# Patient Record
Sex: Female | Born: 1995
Health system: Southern US, Community
[De-identification: ages and names within clinical notes are randomized; demographics above are authoritative.]

## PROBLEM LIST (undated history)

## (undated) DIAGNOSIS — E0500 Thyrotoxicosis with diffuse goiter without thyrotoxic crisis or storm: Secondary | ICD-10-CM

## (undated) DIAGNOSIS — R109 Unspecified abdominal pain: Secondary | ICD-10-CM

## (undated) HISTORY — PX: INCISION AND DRAINAGE: SHX5863

## (undated) HISTORY — DX: Unspecified abdominal pain: R10.9

---
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
## 2017-05-28 DIAGNOSIS — N760 Acute vaginitis: Secondary | ICD-10-CM | POA: Diagnosis not present

## 2017-06-19 DIAGNOSIS — Z111 Encounter for screening for respiratory tuberculosis: Secondary | ICD-10-CM | POA: Diagnosis not present

## 2017-07-25 DIAGNOSIS — Z6834 Body mass index (BMI) 34.0-34.9, adult: Secondary | ICD-10-CM | POA: Diagnosis not present

## 2017-07-25 DIAGNOSIS — R69 Illness, unspecified: Secondary | ICD-10-CM | POA: Diagnosis not present

## 2017-07-25 DIAGNOSIS — Z713 Dietary counseling and surveillance: Secondary | ICD-10-CM | POA: Diagnosis not present

## 2017-07-25 DIAGNOSIS — Z299 Encounter for prophylactic measures, unspecified: Secondary | ICD-10-CM | POA: Diagnosis not present

## 2017-08-23 DIAGNOSIS — R42 Dizziness and giddiness: Secondary | ICD-10-CM | POA: Diagnosis not present

## 2017-08-23 DIAGNOSIS — Z713 Dietary counseling and surveillance: Secondary | ICD-10-CM | POA: Diagnosis not present

## 2017-08-23 DIAGNOSIS — Z299 Encounter for prophylactic measures, unspecified: Secondary | ICD-10-CM | POA: Diagnosis not present

## 2017-08-23 DIAGNOSIS — R69 Illness, unspecified: Secondary | ICD-10-CM | POA: Diagnosis not present

## 2017-10-14 DIAGNOSIS — R5383 Other fatigue: Secondary | ICD-10-CM | POA: Diagnosis not present

## 2017-10-14 DIAGNOSIS — Z79899 Other long term (current) drug therapy: Secondary | ICD-10-CM | POA: Diagnosis not present

## 2017-10-14 DIAGNOSIS — Z0000 Encounter for general adult medical examination without abnormal findings: Secondary | ICD-10-CM | POA: Diagnosis not present

## 2017-10-21 DIAGNOSIS — Z6837 Body mass index (BMI) 37.0-37.9, adult: Secondary | ICD-10-CM | POA: Diagnosis not present

## 2017-10-21 DIAGNOSIS — Z713 Dietary counseling and surveillance: Secondary | ICD-10-CM | POA: Diagnosis not present

## 2017-10-21 DIAGNOSIS — Z299 Encounter for prophylactic measures, unspecified: Secondary | ICD-10-CM | POA: Diagnosis not present

## 2017-10-21 DIAGNOSIS — R69 Illness, unspecified: Secondary | ICD-10-CM | POA: Diagnosis not present

## 2017-10-21 DIAGNOSIS — J029 Acute pharyngitis, unspecified: Secondary | ICD-10-CM | POA: Diagnosis not present

## 2017-12-05 DIAGNOSIS — Z6838 Body mass index (BMI) 38.0-38.9, adult: Secondary | ICD-10-CM | POA: Diagnosis not present

## 2017-12-05 DIAGNOSIS — Z3009 Encounter for other general counseling and advice on contraception: Secondary | ICD-10-CM | POA: Diagnosis not present

## 2017-12-05 DIAGNOSIS — Z299 Encounter for prophylactic measures, unspecified: Secondary | ICD-10-CM | POA: Diagnosis not present

## 2017-12-05 DIAGNOSIS — Z713 Dietary counseling and surveillance: Secondary | ICD-10-CM | POA: Diagnosis not present

## 2018-01-06 DIAGNOSIS — Z299 Encounter for prophylactic measures, unspecified: Secondary | ICD-10-CM | POA: Diagnosis not present

## 2018-01-06 DIAGNOSIS — K219 Gastro-esophageal reflux disease without esophagitis: Secondary | ICD-10-CM | POA: Insufficient documentation

## 2018-01-06 DIAGNOSIS — R42 Dizziness and giddiness: Secondary | ICD-10-CM | POA: Diagnosis not present

## 2018-01-06 DIAGNOSIS — Z6838 Body mass index (BMI) 38.0-38.9, adult: Secondary | ICD-10-CM | POA: Diagnosis not present

## 2018-01-06 DIAGNOSIS — F32A Depression, unspecified: Secondary | ICD-10-CM | POA: Insufficient documentation

## 2018-01-06 DIAGNOSIS — Z3009 Encounter for other general counseling and advice on contraception: Secondary | ICD-10-CM | POA: Diagnosis not present

## 2018-01-07 DIAGNOSIS — H81393 Other peripheral vertigo, bilateral: Secondary | ICD-10-CM | POA: Diagnosis not present

## 2018-01-07 DIAGNOSIS — G9009 Other idiopathic peripheral autonomic neuropathy: Secondary | ICD-10-CM | POA: Diagnosis not present

## 2018-01-07 DIAGNOSIS — I7389 Other specified peripheral vascular diseases: Secondary | ICD-10-CM | POA: Diagnosis not present

## 2018-01-23 DIAGNOSIS — Z789 Other specified health status: Secondary | ICD-10-CM | POA: Diagnosis not present

## 2018-01-23 DIAGNOSIS — Z299 Encounter for prophylactic measures, unspecified: Secondary | ICD-10-CM | POA: Diagnosis not present

## 2018-01-23 DIAGNOSIS — R000 Tachycardia, unspecified: Secondary | ICD-10-CM | POA: Diagnosis not present

## 2018-01-23 DIAGNOSIS — Z6839 Body mass index (BMI) 39.0-39.9, adult: Secondary | ICD-10-CM | POA: Diagnosis not present

## 2018-01-23 LAB — TSH: TSH: 0.09 — AB (ref 0.41–5.90)

## 2018-02-24 DIAGNOSIS — Z299 Encounter for prophylactic measures, unspecified: Secondary | ICD-10-CM | POA: Diagnosis not present

## 2018-02-24 DIAGNOSIS — Z6841 Body Mass Index (BMI) 40.0 and over, adult: Secondary | ICD-10-CM | POA: Diagnosis not present

## 2018-02-24 DIAGNOSIS — R69 Illness, unspecified: Secondary | ICD-10-CM | POA: Diagnosis not present

## 2018-02-24 DIAGNOSIS — R000 Tachycardia, unspecified: Secondary | ICD-10-CM | POA: Diagnosis not present

## 2018-02-26 DIAGNOSIS — Z30430 Encounter for insertion of intrauterine contraceptive device: Secondary | ICD-10-CM | POA: Diagnosis not present

## 2018-02-26 DIAGNOSIS — Z3202 Encounter for pregnancy test, result negative: Secondary | ICD-10-CM | POA: Diagnosis not present

## 2018-03-26 DIAGNOSIS — Z975 Presence of (intrauterine) contraceptive device: Secondary | ICD-10-CM | POA: Diagnosis not present

## 2018-03-27 ENCOUNTER — Encounter: Payer: Self-pay | Admitting: "Endocrinology

## 2018-03-27 ENCOUNTER — Ambulatory Visit (INDEPENDENT_AMBULATORY_CARE_PROVIDER_SITE_OTHER): Payer: 59 | Admitting: "Endocrinology

## 2018-03-27 VITALS — BP 127/87 | HR 109 | Ht 66.0 in | Wt 255.0 lb

## 2018-03-27 DIAGNOSIS — E0590 Thyrotoxicosis, unspecified without thyrotoxic crisis or storm: Secondary | ICD-10-CM

## 2018-03-27 NOTE — Progress Notes (Signed)
Endocrinology Consult Note    Subjective:    Patient ID: Alicia Larsen, female    DOB: 04/09/1995, PCP Alicia Larsen, Alicia B, MD.   Past Medical History:  Diagnosis Date  . Abdominal pain    History reviewed. No pertinent surgical history. Social History   Socioeconomic History  . Marital status: Single    Spouse name: Not on file  . Number of children: Not on file  . Years of education: Not on file  . Highest education level: Not on file  Occupational History  . Not on file  Social Needs  . Financial resource strain: Not on file  . Food insecurity:    Worry: Not on file    Inability: Not on file  . Transportation needs:    Medical: Not on file    Non-medical: Not on file  Tobacco Use  . Smoking status: Never Smoker  . Smokeless tobacco: Never Used  Substance and Sexual Activity  . Alcohol use: Not on file  . Drug use: Not on file  . Sexual activity: Not on file  Lifestyle  . Physical activity:    Days per week: Not on file    Minutes per session: Not on file  . Stress: Not on file  Relationships  . Social connections:    Talks on phone: Not on file    Gets together: Not on file    Attends religious service: Not on file    Active member of club or organization: Not on file    Attends meetings of clubs or organizations: Not on file    Relationship status: Not on file  Other Topics Concern  . Not on file  Social History Narrative   9th grade   Outpatient Encounter Medications as of 03/27/2018  Medication Sig  . escitalopram (LEXAPRO) 20 MG tablet Take by mouth daily.  . propranolol (INDERAL) 10 MG tablet Take 20 mg by mouth 2 (two) times daily.  . [DISCONTINUED] esomeprazole (NEXIUM) 40 MG capsule Take 1 capsule (40 mg total) by mouth daily before breakfast.   No facility-administered encounter medications on file as of 03/27/2018.     ALLERGIES: No Known Allergies  VACCINATION STATUS:  There is no immunization history on file for this  patient.   HPI  Alicia Larsen is 23 y.o. female who presents today with a medical history as above. she is being seen in consultation for hyperthyroidism requested by Alicia Larsen, Alicia B, MD.  she has been dealing with symptoms of anxiety, palpitations, sleep disturbance, tremors, heat intolerance for approximately 3 months. her most recent thyroid labs revealed suppressed TSH and high normal free T4.  She was initiated on propanolol 10 mg p.o. twice daily which helped minimally for palpitations.  she denies dysphagia, choking, shortness of breath, no recent voice change. These symptoms are progressively worsening and troubling to her.   she denies family history of thyroid dysfunction and denies family hx of thyroid cancer. she denies personal history of goiter. she is not on any anti-thyroid medications nor on any thyroid hormone supplements. she  is willing to proceed with appropriate work up and therapy for thyrotoxicosis.  Normal menstrual cycle, not planning pregnancy anytime soon.                           Review of systems  Constitutional: - weight loss, + fatigue, + subjective hyperthermia Eyes: no blurry vision, - xerophthalmia ENT: no sore throat, no nodules palpated in  throat, no dysphagia/odynophagia, nor hoarseness Cardiovascular: no Chest Pain, no Shortness of Breath, +  palpitations, no leg swelling Respiratory: no cough, no SOB Gastrointestinal: no Nausea, no Vomiting, no Diarhhea Musculoskeletal: no muscle/joint aches Skin: no rashes Neurological: +  tremors, no numbness, no tingling, no dizziness Psychiatric: no depression, +  anxiety   Objective:    BP 127/87   Pulse (!) 109   Ht 5\' 6"  (1.676 m)   Wt 255 lb (115.7 kg)   BMI 41.16 kg/m   Wt Readings from Last 3 Encounters:  03/27/18 255 lb (115.7 kg)  05/21/12 162 lb (73.5 kg) (93 %, Z= 1.51)*  04/16/12 161 lb (73 kg) (93 %, Z= 1.50)*   * Growth percentiles are based on CDC (Girls, 2-20 Years) data.                                                 Physical exam  Constitutional: + Obese, not in acute distress, + anxious state of mind Eyes: PERRLA, EOMI, - exophthalmos ENT: moist mucous membranes, +  thyromegaly, no cervical lymphadenopathy Cardiovascular: + Hyperactive precordial activity, + tachycardia, no Murmur/Rubs/Gallops Respiratory:  adequate breathing efforts, no gross chest deformity, Clear to auscultation bilaterally Gastrointestinal: abdomen soft, Non -tender, No distension, Bowel Sounds present Musculoskeletal: no gross deformities, strength intact in all four extremities Skin: moist, warm, no rashes Neurological: ++  tremor with outstretched hands,  + Deep Tendon Reflexes  on both lower extremities.   January 23, 2018 labs: Free T4 elevated at 1.85, TSH suppressed 0.094    Assessment & Plan:   1. Hyperthyroidism  she is being seen at a kind request of Vyas, Alicia B, MD. her history and most recent labs are reviewed, and she was examined clinically. Subjective and objective findings are consistent with thyrotoxicosis likely from primary hyperthyroidism. The potential risks of untreated thyrotoxicosis and the need for definitive therapy have been discussed in detail with her, and she agrees to proceed with diagnostic workup and treatment plan.   I like to repeat full profile thyroid function tests today and confirmatory thyroid uptake and scan will be scheduled to be done as soon as possible.   Options of therapy are discussed with her, including antithyroid medications and radioactive iodine thyroid ablation.  she will return in 10 days with her results for treatment decision.   I advised her to increase propanolol to 20 mg p.o. twice daily  for symptomatic relief.  - I advised her to maintain close follow up with Alicia Specking, MD for primary care needs.   - Time spent with the patient: 35 minutes, of which >50% was spent in obtaining information about her symptoms,  reviewing her previous labs, evaluations, and treatments, counseling her about her hyperthyroidism, and developing a plan to confirm the diagnosis and long term treatment as necessary.  [name] participated in the discussions, expressed understanding, and voiced agreement with the above plans.  All questions were answered to her satisfaction. she is encouraged to contact clinic should she have any questions or concerns prior to her return visit.  Follow up plan: Return in about 10 days (around 04/06/2018) for Labs Today- Non-Fasting Ok, Follow up with Thyroid Uptake and Scan.   Thank you for involving me in the care of this pleasant patient, and I will continue to update you with her progress.  Marquis LunchGebre Nida, MD [facility] Phone: [phone]  Fax: [phone]   03/27/2018, 9:26 AM  This note was partially dictated with voice recognition software. Similar sounding words can be transcribed inadequately or may not  be corrected upon review.

## 2018-03-28 LAB — THYROGLOBULIN ANTIBODY: Thyroglobulin Ab: 1 IU/mL (ref <=1)

## 2018-03-28 LAB — THYROID PEROXIDASE ANTIBODY: Thyroperoxidase Ab SerPl-aCnc: 475 IU/mL — ABNORMAL HIGH (ref <9)

## 2018-03-28 LAB — T4, FREE: FREE T4: 4.5 ng/dL — AB (ref 0.8–1.8)

## 2018-03-28 LAB — T3, FREE: T3, Free: 18 pg/mL — ABNORMAL HIGH (ref 2.3–4.2)

## 2018-03-28 LAB — TSH: TSH: 0.01 m[IU]/L — AB

## 2018-04-01 ENCOUNTER — Encounter (HOSPITAL_COMMUNITY): Payer: Self-pay

## 2018-04-01 ENCOUNTER — Encounter (HOSPITAL_COMMUNITY)
Admission: RE | Admit: 2018-04-01 | Discharge: 2018-04-01 | Disposition: A | Discharge status: Home / Self Care | Payer: 59 | Source: Ambulatory Visit | Attending: "Endocrinology | Admitting: "Endocrinology

## 2018-04-01 DIAGNOSIS — E0590 Thyrotoxicosis, unspecified without thyrotoxic crisis or storm: Secondary | ICD-10-CM | POA: Diagnosis not present

## 2018-04-01 MED ORDER — SODIUM IODIDE I-123 7.4 MBQ CAPS
500.0000 | ORAL_CAPSULE | Freq: Once | ORAL | Status: AC
  Administered 2018-04-01: 430 via ORAL

## 2018-04-02 ENCOUNTER — Encounter (HOSPITAL_COMMUNITY)
Admission: RE | Admit: 2018-04-02 | Discharge: 2018-04-02 | Disposition: A | Discharge status: Home / Self Care | Payer: 59 | Source: Ambulatory Visit | Attending: "Endocrinology | Admitting: "Endocrinology

## 2018-04-02 DIAGNOSIS — E0590 Thyrotoxicosis, unspecified without thyrotoxic crisis or storm: Secondary | ICD-10-CM | POA: Diagnosis not present

## 2018-04-08 ENCOUNTER — Ambulatory Visit: Payer: 59 | Admitting: "Endocrinology

## 2018-04-08 ENCOUNTER — Encounter: Payer: Self-pay | Admitting: "Endocrinology

## 2018-04-08 ENCOUNTER — Ambulatory Visit (INDEPENDENT_AMBULATORY_CARE_PROVIDER_SITE_OTHER): Payer: 59 | Admitting: "Endocrinology

## 2018-04-08 VITALS — BP 169/83 | HR 125 | Ht 66.0 in

## 2018-04-08 VITALS — BP 169/83 | HR 125 | Temp 99.2°F | Ht 66.0 in | Wt 247.0 lb

## 2018-04-08 DIAGNOSIS — E0590 Thyrotoxicosis, unspecified without thyrotoxic crisis or storm: Secondary | ICD-10-CM | POA: Diagnosis not present

## 2018-04-08 DIAGNOSIS — Z79899 Other long term (current) drug therapy: Secondary | ICD-10-CM | POA: Diagnosis not present

## 2018-04-08 DIAGNOSIS — E079 Disorder of thyroid, unspecified: Secondary | ICD-10-CM | POA: Diagnosis not present

## 2018-04-08 DIAGNOSIS — I10 Essential (primary) hypertension: Secondary | ICD-10-CM | POA: Diagnosis not present

## 2018-04-08 DIAGNOSIS — E0501 Thyrotoxicosis with diffuse goiter with thyrotoxic crisis or storm: Secondary | ICD-10-CM | POA: Diagnosis not present

## 2018-04-08 DIAGNOSIS — R000 Tachycardia, unspecified: Secondary | ICD-10-CM | POA: Diagnosis not present

## 2018-04-08 DIAGNOSIS — E0500 Thyrotoxicosis with diffuse goiter without thyrotoxic crisis or storm: Secondary | ICD-10-CM | POA: Diagnosis not present

## 2018-04-08 MED ORDER — PREDNISONE 20 MG PO TABS: 20.0000 mg | ORAL_TABLET | Freq: Every day | ORAL | 1 refills | Status: DC

## 2018-04-08 NOTE — Progress Notes (Signed)
Endocrinology follow-up  Note    Subjective:    Patient ID: Alicia Larsen, female    DOB: 1995-04-04, PCP Ignatius Specking, MD.   Past Medical History:  Diagnosis Date  . Abdominal pain    History reviewed. No pertinent surgical history. Social History   Socioeconomic History  . Marital status: Single    Spouse name: Not on file  . Number of children: Not on file  . Years of education: Not on file  . Highest education level: Not on file  Occupational History  . Not on file  Social Needs  . Financial resource strain: Not on file  . Food insecurity:    Worry: Not on file    Inability: Not on file  . Transportation needs:    Medical: Not on file    Non-medical: Not on file  Tobacco Use  . Smoking status: Never Smoker  . Smokeless tobacco: Never Used  Substance and Sexual Activity  . Alcohol use: Not Currently  . Drug use: Never  . Sexual activity: Not on file  Lifestyle  . Physical activity:    Days per week: Not on file    Minutes per session: Not on file  . Stress: Not on file  Relationships  . Social connections:    Talks on phone: Not on file    Gets together: Not on file    Attends religious service: Not on file    Active member of club or organization: Not on file    Attends meetings of clubs or organizations: Not on file    Relationship status: Not on file  Other Topics Concern  . Not on file  Social History Narrative   9th grade   Outpatient Encounter Medications as of 04/08/2018  Medication Sig  . escitalopram (LEXAPRO) 20 MG tablet Take by mouth daily.  . predniSONE (DELTASONE) 20 MG tablet Take 1 tablet (20 mg total) by mouth daily with breakfast.  . propranolol (INDERAL) 10 MG tablet Take 40 mg by mouth 3 (three) times daily with meals.   No facility-administered encounter medications on file as of 04/08/2018.     ALLERGIES: No Known Allergies  VACCINATION STATUS:  There is no immunization history on file for this  patient.   HPI  Alicia Larsen is 23 y.o. female who presents today to review her recent studies after she was seen in consultation for hyperthyroidism  requested by Ignatius Specking, MD.  she has been dealing with symptoms of anxiety, palpitations, sleep disturbance, tremors, heat intolerance for approximately 3 months.  The symptoms are getting worse.  She feels nauseous this morning, denies vomiting.  She has not been taking her propranolol twice a day due to nausea.  Her most recent thyroid function tests reveal higher thyroid hormone levels as well as thyroid uptake and scan shows 71.1% uptake in 24 hours suggesting Graves' disease.  she denies dysphagia, choking, shortness of breath, no recent voice change. These symptoms are progressively worsening and troubling to her.   she denies family history of thyroid dysfunction and denies family hx of thyroid cancer. she denies personal history of goiter. she is not on any anti-thyroid medications nor on any thyroid hormone supplements. she  is willing to proceed with appropriate work up and therapy for thyrotoxicosis.  Normal menstrual cycle, not planning pregnancy anytime soon.                           Review  of systems  Constitutional: - weight loss, + fatigue, + subjective hyperthermia Eyes: no blurry vision, - xerophthalmia ENT: no sore throat, no nodules palpated in throat, no dysphagia/odynophagia, nor hoarseness Cardiovascular: no Chest Pain, no Shortness of Breath, +  palpitations, no leg swelling Respiratory: no cough, no SOB Gastrointestinal: + Nausea, no Vomiting, no Diarhhea Musculoskeletal: no muscle/joint aches Skin: no rashes Neurological: +  tremors, no numbness, no tingling, no dizziness Psychiatric: no depression, +  anxiety   Objective:    BP (!) 169/83   Pulse (!) 125   Temp 99.2 F (37.3 C)   Ht 5\' 6"  (1.676 m)   Wt 247 lb (112 kg)   BMI 39.87 kg/m   Wt Readings from Last 3 Encounters:  04/08/18 247 lb (112  kg)  03/27/18 255 lb (115.7 kg)  05/21/12 162 lb (73.5 kg) (93 %, Z= 1.51)*   * Growth percentiles are based on CDC (Girls, 2-20 Years) data.                                                Physical exam  Constitutional: + Obese, + sick looking,  + anxious state of mind Eyes: PERRLA, EOMI, - exophthalmos ENT: moist mucous membranes, +  thyromegaly, no cervical lymphadenopathy Cardiovascular: + Hyperactive precordial activity, + tachycardia, no Murmur/Rubs/Gallops Respiratory:  adequate breathing efforts, no gross chest deformity, Clear to auscultation bilaterally Gastrointestinal: abdomen soft, Non -tender, No distension, Bowel Sounds present Musculoskeletal: no gross deformities, strength intact in all four extremities Skin: moist, warm, no rashes Neurological: ++  tremor with outstretched hands,  + Deep Tendon Reflexes  on both lower extremities.  Recent Results (from the past 2160 hour(s))  TSH     Status: Abnormal   Collection Time: 01/23/18 12:00 AM  Result Value Ref Range   TSH 0.09 (A) 0.41 - 5.90    Comment: free T4 1.85  TSH     Status: Abnormal   Collection Time: 03/27/18  9:21 AM  Result Value Ref Range   TSH 0.01 (L) mIU/L    Comment:           Reference Range .           > or = 20 Years  0.40-4.50 .                Pregnancy Ranges           First trimester    0.26-2.66           Second trimester   0.55-2.73           Third trimester    0.43-2.91   T4, free     Status: Abnormal   Collection Time: 03/27/18  9:21 AM  Result Value Ref Range   Free T4 4.5 (H) 0.8 - 1.8 ng/dL  T3, free     Status: Abnormal   Collection Time: 03/27/18  9:21 AM  Result Value Ref Range   T3, Free 18.0 (H) 2.3 - 4.2 pg/mL  Thyroid peroxidase antibody     Status: Abnormal   Collection Time: 03/27/18  9:21 AM  Result Value Ref Range   Thyroperoxidase Ab SerPl-aCnc 475 (H) <9 IU/mL  Thyroglobulin antibody     Status: None   Collection Time: 03/27/18  9:21 AM  Result Value Ref Range    Thyroglobulin Ab 1 <  or = 1 IU/mL    January 23, 2018 labs: Free T4 elevated at 1.85, TSH suppressed 0.094   April 02, 2018 thyroid uptake and scan FINDINGS: Homogeneous tracer distribution in both thyroid lobes.  No focal areas of increased or decreased tracer localization seen.  4 hour I-123 uptake = 55.2% (normal 5-20%)  24 hour I-123 uptake = 71.7% (normal 10-30%)  IMPRESSION: Normal thyroid scan.  Significantly elevated 4 hour and 24 hour radio iodine uptakes consistent with hyperthyroidism.  Overall, findings are consistent with Graves disease.  Assessment & Plan:   1.  Severe toxicosis  2.  Graves' disease 3.  Impending thyroid storm  she is being seen at a kind request of Vyas, Dhruv B, MD. her history and most recent labs are reviewed, and she was examined clinically.   -Her repeat work-up confirms primary hyperthyroidism from Graves' disease presenting with severe thyrotoxicosis with signs/symptoms suggestive of impending thyroid storm.    - She will need immediate intervention with inpatient treatment for thyroid storm before considering definitive antithyroid management which will take time.    -I discussed the rare possibility of thyroid storm with the patient and concern of high mortality with this diagnosis unless it is treated immediately.  [facility] understands [facility].  She is sent with a note to go to emergency room directly and not at home.    -She will need high-dose beta-blockers in the form of propanolol, high-dose propylthiouracil, hydrocortisone, iodine solution, as well as bile acid sequestrant as inpatient for the next several days.  She is advised to call this clinic to update of her progress and we will also attempt to call her back later today.  - I advised her to maintain close follow up with Ignatius SpeckingVyas, Dhruv B, MD for primary care needs.   - Time spent with the patient: 25 min, of which  >50% was spent in reviewing her  current and  previous labs/studies, previous treatments, and medications doses and developing a plan for long-term care based on the latest recommendations for standards of care.  Alyse LowRachel Tate participated in the discussions, expressed understanding, and voiced agreement with the above plans.  All questions were answered to her satisfaction. she is encouraged to contact clinic should she have any questions or concerns prior to her return visit.   Follow up plan: She is advised to return in 7 to 10 days for reevaluation.  Thank you for involving me in the care of this pleasant patient, and I will continue to update you with her progress.  Marquis LunchGebre Leamon Palau, MD [facility] Phone: [phone]  Fax: [phone]   04/08/2018, 2:56 PM  This note was partially dictated with voice recognition software. Similar sounding words can be transcribed inadequately or may not  be corrected upon review.

## 2018-04-09 DIAGNOSIS — K219 Gastro-esophageal reflux disease without esophagitis: Secondary | ICD-10-CM | POA: Diagnosis not present

## 2018-04-09 DIAGNOSIS — E876 Hypokalemia: Secondary | ICD-10-CM | POA: Diagnosis not present

## 2018-04-09 DIAGNOSIS — R002 Palpitations: Secondary | ICD-10-CM | POA: Diagnosis not present

## 2018-04-09 DIAGNOSIS — L0501 Pilonidal cyst with abscess: Secondary | ICD-10-CM | POA: Diagnosis not present

## 2018-04-09 DIAGNOSIS — L0291 Cutaneous abscess, unspecified: Secondary | ICD-10-CM | POA: Diagnosis not present

## 2018-04-09 DIAGNOSIS — I10 Essential (primary) hypertension: Secondary | ICD-10-CM | POA: Diagnosis not present

## 2018-04-09 DIAGNOSIS — E0500 Thyrotoxicosis with diffuse goiter without thyrotoxic crisis or storm: Secondary | ICD-10-CM | POA: Diagnosis not present

## 2018-04-09 DIAGNOSIS — R161 Splenomegaly, not elsewhere classified: Secondary | ICD-10-CM | POA: Diagnosis not present

## 2018-04-09 DIAGNOSIS — R42 Dizziness and giddiness: Secondary | ICD-10-CM | POA: Diagnosis not present

## 2018-04-09 DIAGNOSIS — R197 Diarrhea, unspecified: Secondary | ICD-10-CM | POA: Diagnosis not present

## 2018-04-09 DIAGNOSIS — N170 Acute kidney failure with tubular necrosis: Secondary | ICD-10-CM | POA: Diagnosis not present

## 2018-04-09 DIAGNOSIS — E0501 Thyrotoxicosis with diffuse goiter with thyrotoxic crisis or storm: Secondary | ICD-10-CM | POA: Diagnosis not present

## 2018-04-09 DIAGNOSIS — N179 Acute kidney failure, unspecified: Secondary | ICD-10-CM | POA: Diagnosis not present

## 2018-04-09 DIAGNOSIS — L0231 Cutaneous abscess of buttock: Secondary | ICD-10-CM | POA: Diagnosis not present

## 2018-04-09 DIAGNOSIS — E0591 Thyrotoxicosis, unspecified with thyrotoxic crisis or storm: Secondary | ICD-10-CM | POA: Diagnosis not present

## 2018-04-09 DIAGNOSIS — R69 Illness, unspecified: Secondary | ICD-10-CM | POA: Diagnosis not present

## 2018-04-10 ENCOUNTER — Telehealth: Payer: Self-pay

## 2018-04-10 ENCOUNTER — Ambulatory Visit: Payer: 59 | Admitting: "Endocrinology

## 2018-04-10 NOTE — Telephone Encounter (Signed)
Thank you very much. That is exactly what my worry was on her.

## 2018-04-10 NOTE — Telephone Encounter (Signed)
I called to check on the patient she states she is doing better patient is in the ICU at [facility] thinking she may be released on Friiday being treated for a Thyroid storm taking medications to stop the thyroid from working because is was causing her to have tachycardia

## 2018-04-11 DIAGNOSIS — R42 Dizziness and giddiness: Secondary | ICD-10-CM | POA: Insufficient documentation

## 2018-04-17 DIAGNOSIS — N170 Acute kidney failure with tubular necrosis: Secondary | ICD-10-CM | POA: Diagnosis not present

## 2018-04-17 DIAGNOSIS — L0501 Pilonidal cyst with abscess: Secondary | ICD-10-CM | POA: Diagnosis not present

## 2018-04-17 DIAGNOSIS — Z7952 Long term (current) use of systemic steroids: Secondary | ICD-10-CM | POA: Diagnosis not present

## 2018-04-17 DIAGNOSIS — R42 Dizziness and giddiness: Secondary | ICD-10-CM | POA: Diagnosis not present

## 2018-04-17 DIAGNOSIS — E0541 Thyrotoxicosis factitia with thyrotoxic crisis or storm: Secondary | ICD-10-CM | POA: Diagnosis not present

## 2018-04-17 DIAGNOSIS — N990 Postprocedural (acute) (chronic) kidney failure: Secondary | ICD-10-CM | POA: Diagnosis not present

## 2018-04-21 DIAGNOSIS — R42 Dizziness and giddiness: Secondary | ICD-10-CM | POA: Diagnosis not present

## 2018-04-21 DIAGNOSIS — L0501 Pilonidal cyst with abscess: Secondary | ICD-10-CM | POA: Diagnosis not present

## 2018-04-21 DIAGNOSIS — N170 Acute kidney failure with tubular necrosis: Secondary | ICD-10-CM | POA: Diagnosis not present

## 2018-04-21 DIAGNOSIS — Z7952 Long term (current) use of systemic steroids: Secondary | ICD-10-CM | POA: Diagnosis not present

## 2018-04-21 DIAGNOSIS — N990 Postprocedural (acute) (chronic) kidney failure: Secondary | ICD-10-CM | POA: Diagnosis not present

## 2018-04-21 DIAGNOSIS — E0541 Thyrotoxicosis factitia with thyrotoxic crisis or storm: Secondary | ICD-10-CM | POA: Diagnosis not present

## 2018-04-23 ENCOUNTER — Ambulatory Visit: Payer: 59 | Admitting: "Endocrinology

## 2018-04-23 DIAGNOSIS — N990 Postprocedural (acute) (chronic) kidney failure: Secondary | ICD-10-CM | POA: Diagnosis not present

## 2018-04-23 DIAGNOSIS — S21209A Unspecified open wound of unspecified back wall of thorax without penetration into thoracic cavity, initial encounter: Secondary | ICD-10-CM | POA: Diagnosis not present

## 2018-04-23 DIAGNOSIS — R42 Dizziness and giddiness: Secondary | ICD-10-CM | POA: Diagnosis not present

## 2018-04-23 DIAGNOSIS — Z299 Encounter for prophylactic measures, unspecified: Secondary | ICD-10-CM | POA: Diagnosis not present

## 2018-04-23 DIAGNOSIS — L0501 Pilonidal cyst with abscess: Secondary | ICD-10-CM | POA: Diagnosis not present

## 2018-04-23 DIAGNOSIS — Z6837 Body mass index (BMI) 37.0-37.9, adult: Secondary | ICD-10-CM | POA: Diagnosis not present

## 2018-04-23 DIAGNOSIS — E0591 Thyrotoxicosis, unspecified with thyrotoxic crisis or storm: Secondary | ICD-10-CM | POA: Diagnosis not present

## 2018-04-23 DIAGNOSIS — E0541 Thyrotoxicosis factitia with thyrotoxic crisis or storm: Secondary | ICD-10-CM | POA: Diagnosis not present

## 2018-04-23 DIAGNOSIS — R69 Illness, unspecified: Secondary | ICD-10-CM | POA: Diagnosis not present

## 2018-04-23 DIAGNOSIS — N170 Acute kidney failure with tubular necrosis: Secondary | ICD-10-CM | POA: Diagnosis not present

## 2018-04-23 DIAGNOSIS — Z7952 Long term (current) use of systemic steroids: Secondary | ICD-10-CM | POA: Diagnosis not present

## 2018-05-04 DIAGNOSIS — N990 Postprocedural (acute) (chronic) kidney failure: Secondary | ICD-10-CM | POA: Diagnosis not present

## 2018-05-04 DIAGNOSIS — L0501 Pilonidal cyst with abscess: Secondary | ICD-10-CM | POA: Diagnosis not present

## 2018-05-04 DIAGNOSIS — E0541 Thyrotoxicosis factitia with thyrotoxic crisis or storm: Secondary | ICD-10-CM | POA: Diagnosis not present

## 2018-05-07 ENCOUNTER — Ambulatory Visit (INDEPENDENT_AMBULATORY_CARE_PROVIDER_SITE_OTHER): Payer: 59 | Admitting: "Endocrinology

## 2018-05-07 ENCOUNTER — Encounter: Payer: Self-pay | Admitting: "Endocrinology

## 2018-05-07 VITALS — BP 130/89 | HR 87 | Ht 66.0 in | Wt 245.0 lb

## 2018-05-07 DIAGNOSIS — E0590 Thyrotoxicosis, unspecified without thyrotoxic crisis or storm: Secondary | ICD-10-CM

## 2018-05-07 DIAGNOSIS — E0500 Thyrotoxicosis with diffuse goiter without thyrotoxic crisis or storm: Secondary | ICD-10-CM | POA: Diagnosis not present

## 2018-05-07 MED ORDER — PREDNISONE 10 MG PO TABS: 10.0000 mg | ORAL_TABLET | Freq: Every day | ORAL | 1 refills | Status: DC

## 2018-05-07 NOTE — Progress Notes (Signed)
Endocrinology follow-up  Note    Subjective:    Patient ID: Alicia Larsen, female    DOB: Aug 16, 1995, PCP Ignatius Specking, MD.   Past Medical History:  Diagnosis Date  . Abdominal pain    History reviewed. No pertinent surgical history. Social History   Socioeconomic History  . Marital status: Single    Spouse name: Not on file  . Number of children: Not on file  . Years of education: Not on file  . Highest education level: Not on file  Occupational History  . Not on file  Social Needs  . Financial resource strain: Not on file  . Food insecurity:    Worry: Not on file    Inability: Not on file  . Transportation needs:    Medical: Not on file    Non-medical: Not on file  Tobacco Use  . Smoking status: Never Smoker  . Smokeless tobacco: Never Used  Substance and Sexual Activity  . Alcohol use: Not Currently  . Drug use: Never  . Sexual activity: Not on file  Lifestyle  . Physical activity:    Days per week: Not on file    Minutes per session: Not on file  . Stress: Not on file  Relationships  . Social connections:    Talks on phone: Not on file    Gets together: Not on file    Attends religious service: Not on file    Active member of club or organization: Not on file    Attends meetings of clubs or organizations: Not on file    Relationship status: Not on file  Other Topics Concern  . Not on file  Social History Narrative   9th grade   Outpatient Encounter Medications as of 05/07/2018  Medication Sig  . escitalopram (LEXAPRO) 20 MG tablet Take by mouth daily.  . methimazole (TAPAZOLE) 10 MG tablet Take 10 mg by mouth 2 (two) times daily with a meal.  . predniSONE (DELTASONE) 10 MG tablet Take 1 tablet (10 mg total) by mouth daily with breakfast.  . propranolol (INDERAL) 10 MG tablet Take 20 mg by mouth 2 (two) times daily with a meal.  . [DISCONTINUED] predniSONE (DELTASONE) 20 MG tablet Take 1 tablet (20 mg total) by mouth daily with breakfast.    No facility-administered encounter medications on file as of 05/07/2018.     ALLERGIES: No Known Allergies  VACCINATION STATUS:  There is no immunization history on file for this patient.   HPI  Alicia Larsen is 23 y.o. female .  She was diagnosed with primary hyperthyroidism from Graves' disease prior to her last visit on April 08, 2018 when she presented with symptoms consistent with thyroid storm.  She was treated as an inpatient for 9 days in [facility].  She was discharged on multiple daily dose of methimazole, propanolol, as well as prednisone.  She continues to improve symptomatically-denies palpitations, heat intolerance, nor tremors.  She does not have recent thyroid function tests.     Her most recent thyroid function tests reveal higher thyroid hormone levels as well as thyroid uptake and scan shows 71.1% uptake in 24 hours suggesting Graves' disease.   she denies family history of thyroid dysfunction and denies family hx of thyroid cancer. she denies personal history of goiter.she  is willing to proceed with appropriate work up and therapy for thyrotoxicosis.  She is not planning any pregnancy in the near future.  Review of systems  Constitutional: + weight loss, + fatigue, + subjective hyperthermia Eyes: no blurry vision, - xerophthalmia ENT: no sore throat, no nodules palpated in throat, no dysphagia/odynophagia, nor hoarseness Cardiovascular: no Chest Pain, no Shortness of Breath, -  palpitations, no leg swelling Respiratory: no cough, no SOB Gastrointestinal: -  Nausea, no Vomiting, no Diarhhea Musculoskeletal: no muscle/joint aches Skin: no rashes Neurological: -  tremors, no numbness, no tingling, no dizziness Psychiatric: no depression, +  anxiety   Objective:    BP 130/89   Pulse 87   Ht  (1.676 m)   Wt 245 lb (111.1 kg)   BMI 39.54 kg/m   Wt Readings from Last 3 Encounters:  05/07/18 245 lb (111.1 kg)   04/08/18 247 lb (112 kg)  03/27/18 255 lb (115.7 kg)                                                Physical exam  Constitutional: + Obese,  + anxious state of mind Eyes: PERRLA, EOMI, - exophthalmos ENT: moist mucous membranes, +  thyromegaly, no cervical lymphadenopathy Cardiovascular: No tachycardia,  no Murmur/Rubs/Gallops Respiratory:  adequate breathing efforts, no gross chest deformity, Clear to auscultation bilaterally Gastrointestinal: abdomen soft, Non -tender, No distension, Bowel Sounds present Musculoskeletal: no gross deformities, strength intact in all four extremities Skin: moist, warm, no rashes Neurological: +  tremor with outstretched hands,  + Deep Tendon Reflexes  on both lower extremities.  Recent Results (from the past 2160 hour(s))  TSH     Status: Abnormal   Collection Time: 03/27/18  9:21 AM  Result Value Ref Range   TSH 0.01 (L) mIU/L    Comment:           Reference Range .           > or = 20 Years  0.40-4.50 .                Pregnancy Ranges           First trimester    0.26-2.66           Second trimester   0.55-2.73           Third trimester    0.43-2.91   T4, free     Status: Abnormal   Collection Time: 03/27/18  9:21 AM  Result Value Ref Range   Free T4 4.5 (H) 0.8 - 1.8 ng/dL  T3, free     Status: Abnormal   Collection Time: 03/27/18  9:21 AM  Result Value Ref Range   T3, Free 18.0 (H) 2.3 - 4.2 pg/mL  Thyroid peroxidase antibody     Status: Abnormal   Collection Time: 03/27/18  9:21 AM  Result Value Ref Range   Thyroperoxidase Ab SerPl-aCnc 475 (H) <9 IU/mL  Thyroglobulin antibody     Status: None   Collection Time: 03/27/18  9:21 AM  Result Value Ref Range   Thyroglobulin Ab 1 < or = 1 IU/mL     April 02, 2018 thyroid uptake and scan FINDINGS: Homogeneous tracer distribution in both thyroid lobes.  No focal areas of increased or decreased tracer localization seen.  4 hour I-123 uptake = 55.2% (normal 5-20%)  24 hour  I-123 uptake = 71.7% (normal 10-30%)  IMPRESSION: Normal thyroid scan.  Significantly elevated 4 hour and 24 hour  radio iodine uptakes consistent with hyperthyroidism.  Overall, findings are consistent with Graves disease.    Assessment & Plan:   1.  Severe thyrotoxicosis  2.  Graves' disease 3 .  Status post thyroid storm-resolved  -She is status post 9 days of inpatient treatment for thyroid storm in [facility] with clinical response.  She will need definitive treatment with I-131 thyroid ablation once her thyroid is reasonably controlled.   She does not have recent thyroid function tests. -She will be sent to lab for new set of labs today and she will return in 1 week for reevaluation. -Her thyroid second scan was still recent from January 2020, I-131 treatment for ablation will be considered on her next visit. -In the meantime, I advised her to lower her methimazole to 10 mg p.o. twice daily, lower propanolol to 20 mg p.o. twice daily, lower her prednisone to 10 mg p.o. daily.  - I advised her to maintain close follow up with Ignatius Specking, MD for primary care needs.  - Time spent with the patient: 25 min, of which >50% was spent in reviewing her  current and  previous labs/studies, previous treatments, and medications doses and developing a plan for long-term care based on the latest recommendations for standards of care. Dvora Roesner participated in the discussions, expressed understanding, and voiced agreement with the above plans.  All questions were answered to her satisfaction. she is encouraged to contact clinic should she have any questions or concerns prior to her return visit.  Follow up plan: She is advised to return in 7 days for reevaluation.   Marquis Lunch, MD [facility] Phone: [phone]  Fax: [phone]   05/07/2018, 12:22 PM  This note was partially dictated with voice recognition  software. Similar sounding words can be transcribed inadequately or may not  be corrected upon review.

## 2018-05-08 LAB — T3, FREE: T3 FREE: 3.9 pg/mL (ref 2.3–4.2)

## 2018-05-08 LAB — TSH: TSH: 0.01 m[IU]/L — AB

## 2018-05-08 LAB — T4, FREE: FREE T4: 1.4 ng/dL (ref 0.8–1.8)

## 2018-05-14 ENCOUNTER — Encounter: Payer: Self-pay | Admitting: "Endocrinology

## 2018-05-14 ENCOUNTER — Ambulatory Visit (INDEPENDENT_AMBULATORY_CARE_PROVIDER_SITE_OTHER): Payer: 59 | Admitting: "Endocrinology

## 2018-05-14 VITALS — BP 133/85 | HR 91 | Ht 66.0 in | Wt 254.0 lb

## 2018-05-14 DIAGNOSIS — E0500 Thyrotoxicosis with diffuse goiter without thyrotoxic crisis or storm: Secondary | ICD-10-CM

## 2018-05-14 DIAGNOSIS — E0590 Thyrotoxicosis, unspecified without thyrotoxic crisis or storm: Secondary | ICD-10-CM

## 2018-05-14 NOTE — Progress Notes (Signed)
Endocrinology follow-up  Note    Subjective:    Patient Alicia Larsen, female    DOB: [birth year], PCP Ignatius Specking, MD.   Past Medical History:  Diagnosis Date  . Abdominal pain    History reviewed. No pertinent surgical history. Social History   Socioeconomic History  . Marital status: Single    Spouse name: Not on file  . Number of children: Not on file  . Years of education: Not on file  . Highest education level: Not on file  Occupational History  . Not on file  Social Needs  . Financial resource strain: Not on file  . Food insecurity:    Worry: Not on file    Inability: Not on file  . Transportation needs:    Medical: Not on file    Non-medical: Not on file  Tobacco Use  . Smoking status: Never Smoker  . Smokeless tobacco: Never Used  Substance and Sexual Activity  . Alcohol use: Not Currently  . Drug use: Never  . Sexual activity: Not on file  Lifestyle  . Physical activity:    Days per week: Not on file    Minutes per session: Not on file  . Stress: Not on file  Relationships  . Social connections:    Talks on phone: Not on file    Gets together: Not on file    Attends religious service: Not on file    Active member of club or organization: Not on file    Attends meetings of clubs or organizations: Not on file    Relationship status: Not on file  Other Topics Concern  . Not on file  Social History Narrative   9th grade   Outpatient Encounter Medications as of 05/14/2018  Medication Sig  . escitalopram (LEXAPRO) 20 MG tablet Take by mouth daily.  . predniSONE (DELTASONE) 10 MG tablet Take 1 tablet (10 mg total) by mouth daily with breakfast.  . propranolol (INDERAL) 10 MG tablet Take 20 mg by mouth 2 (two) times daily with a meal.  . [DISCONTINUED] methimazole (TAPAZOLE) 10 MG tablet Take 10 mg by mouth 2 (two) times daily with a meal.   No facility-administered encounter medications on file as of 05/14/2018.     ALLERGIES: No  Known Allergies  VACCINATION STATUS:  There is no immunization history on file for this patient.   HPI  Alicia Larsen is 23 y.o. female .  She was diagnosed with primary hyperthyroidism from Graves' disease prior to her last visit on April 08, 2018 when she presented with symptoms consistent with thyroid storm.  She was treated as an inpatient for 9 days in [facility].  She was discharged on multiple daily dose of methimazole, propanolol, as well as prednisone.  She continues to improve symptomatically-denies palpitations, heat intolerance, nor tremors.  She does not have recent thyroid function tests.     Her most recent thyroid function tests are consistent with treatment effect to manageable levels.  Her recent thyroid uptake and scan showed 71.1% uptake in 24 hours suggesting Graves' disease.     she denies family history of thyroid dysfunction and denies family hx of thyroid cancer. she denies personal history of goiter.she  is willing to proceed with appropriate work up and therapy for thyrotoxicosis.  She is not planning any pregnancy in the near future.  Review of systems  Constitutional: + weight gain,  + fatigue, + subjective hyperthermia Eyes: no blurry vision, - xerophthalmia ENT: no sore throat, no nodules palpated in throat, no dysphagia/odynophagia, nor hoarseness Cardiovascular: no Chest Pain, no Shortness of Breath, +  palpitations, no leg swelling Respiratory: no cough, no SOB Gastrointestinal: -  Nausea, no Vomiting, no Diarhhea Musculoskeletal: no muscle/joint aches Skin: no rashes Neurological: -  tremors, no numbness, no tingling, no dizziness Psychiatric: no depression, +  anxiety   Objective:    BP 133/85   Pulse 91   Ht 5\' 6"  (1.676 m)   Wt 254 lb (115.2 kg)   BMI 41.00 kg/m   Wt Readings from Last 3 Encounters:  05/14/18 254 lb (115.2 kg)  05/07/18 245 lb (111.1 kg)  04/08/18 247 lb (112 kg)                                                 Physical exam  Constitutional: + Obese,  + anxious state of mind Eyes: PERRLA, EOMI, - exophthalmos ENT: moist mucous membranes, +  thyromegaly, no cervical lymphadenopathy Cardiovascular: No tachycardia,  no Murmur/Rubs/Gallops Respiratory:  adequate breathing efforts, no gross chest deformity, Clear to auscultation bilaterally Gastrointestinal: abdomen soft, Non -tender, No distension, Bowel Sounds present Musculoskeletal: no gross deformities, strength intact in all four extremities Skin: moist, warm, no rashes Neurological: +  tremor with outstretched hands,  + Deep Tendon Reflexes  on both lower extremities.  Recent Results (from the past 2160 hour(s))  TSH     Status: Abnormal   Collection Time: 03/27/18  9:21 AM  Result Value Ref Range   TSH 0.01 (L) mIU/L    Comment:           Reference Range .           > or = 20 Years  0.40-4.50 .                Pregnancy Ranges           First trimester    0.26-2.66           Second trimester   0.55-2.73           Third trimester    0.43-2.91   T4, free     Status: Abnormal   Collection Time: 03/27/18  9:21 AM  Result Value Ref Range   Free T4 4.5 (H) 0.8 - 1.8 ng/dL  T3, free     Status: Abnormal   Collection Time: 03/27/18  9:21 AM  Result Value Ref Range   T3, Free 18.0 (H) 2.3 - 4.2 pg/mL  Thyroid peroxidase antibody     Status: Abnormal   Collection Time: 03/27/18  9:21 AM  Result Value Ref Range   Thyroperoxidase Ab SerPl-aCnc 475 (H) <9 IU/mL  Thyroglobulin antibody     Status: None   Collection Time: 03/27/18  9:21 AM  Result Value Ref Range   Thyroglobulin Ab 1 < or = 1 IU/mL  TSH     Status: Abnormal   Collection Time: 05/07/18 12:14 PM  Result Value Ref Range   TSH 0.01 (L) mIU/L    Comment:           Reference Range .           > or = 20 Years  0.40-4.50 .  Pregnancy Ranges           First trimester    0.26-2.66           Second trimester   0.55-2.73            Third trimester    0.43-2.91   T4, free     Status: None   Collection Time: 05/07/18 12:14 PM  Result Value Ref Range   Free T4 1.4 0.8 - 1.8 ng/dL  T3, free     Status: None   Collection Time: 05/07/18 12:14 PM  Result Value Ref Range   T3, Free 3.9 2.3 - 4.2 pg/mL     April 02, 2018 thyroid uptake and scan FINDINGS: Homogeneous tracer distribution in both thyroid lobes.  No focal areas of increased or decreased tracer localization seen.  4 hour I-123 uptake = 55.2% (normal 5-20%)  24 hour I-123 uptake = 71.7% (normal 10-30%)  IMPRESSION: Normal thyroid scan.  Significantly elevated 4 hour and 24 hour radio iodine uptakes consistent with hyperthyroidism.  Overall, findings are consistent with Graves disease.    Assessment & Plan:   1.  Severe thyrotoxicosis  2.  Graves' disease 3 .  Status post thyroid storm-resolved  -She is status post 9 days of inpatient treatment for thyroid storm in [facility] with clinical response.  Her most recent thyroid function tests are showing treatment effect to manageable levels.  She will need definitive treatment with I-131 thyroid ablation . -She is advised to discontinue methimazole today.  She will be called from nuclear medicine for I-131 thyroid ablation 5 days later.  She understands the subsequent right long need for thyroid hormone replacement alone thyroid ablation with I-131.  -In the meantime, she is advised to continue propanolol 20 mg p.o. twice daily, prednisone 10 mg p.o. daily.  - I advised her to maintain close follow up with Ignatius Specking, MD for primary care needs.  Ethelyne Lince participated in the discussions, expressed understanding, and voiced agreement with the above plans.  All questions were answered to her satisfaction. she is encouraged to contact clinic should she have any questions or concerns prior to her return visit.   Follow up plan: She is advised to return in 10 weeks with thyroid  function tests for reevaluation.   Marquis Lunch, MD [facility] Phone: [phone]  Fax: [phone]   05/14/2018, 1:08 PM  This note was partially dictated with voice recognition software. Similar sounding words can be transcribed inadequately or may not  be corrected upon review.

## 2018-05-14 NOTE — Progress Notes (Signed)
LeighAnn Harrell Niehoff, CMA  

## 2018-05-20 ENCOUNTER — Other Ambulatory Visit: Payer: Self-pay | Admitting: "Endocrinology

## 2018-05-20 DIAGNOSIS — Z01818 Encounter for other preprocedural examination: Secondary | ICD-10-CM

## 2018-05-30 ENCOUNTER — Encounter (HOSPITAL_COMMUNITY): Payer: Self-pay

## 2018-05-30 ENCOUNTER — Other Ambulatory Visit: Payer: Self-pay

## 2018-05-30 ENCOUNTER — Encounter (HOSPITAL_COMMUNITY)
Admission: RE | Admit: 2018-05-30 | Discharge: 2018-05-30 | Disposition: A | Discharge status: Home / Self Care | Payer: BC Managed Care – PPO | Source: Ambulatory Visit | Attending: "Endocrinology | Admitting: "Endocrinology

## 2018-05-30 ENCOUNTER — Other Ambulatory Visit (HOSPITAL_COMMUNITY)
Admission: RE | Admit: 2018-05-30 | Discharge: 2018-05-30 | Disposition: A | Discharge status: Home / Self Care | Payer: BC Managed Care – PPO | Source: Ambulatory Visit | Attending: "Endocrinology | Admitting: "Endocrinology

## 2018-05-30 DIAGNOSIS — Z01818 Encounter for other preprocedural examination: Secondary | ICD-10-CM | POA: Insufficient documentation

## 2018-05-30 DIAGNOSIS — E0590 Thyrotoxicosis, unspecified without thyrotoxic crisis or storm: Secondary | ICD-10-CM | POA: Diagnosis present

## 2018-05-30 DIAGNOSIS — E0500 Thyrotoxicosis with diffuse goiter without thyrotoxic crisis or storm: Secondary | ICD-10-CM | POA: Insufficient documentation

## 2018-05-30 LAB — PREGNANCY, URINE: PREG TEST UR: NEGATIVE

## 2018-05-30 MED ORDER — SODIUM IODIDE I 131 CAPSULE
12.0000 | Freq: Once | INTRAVENOUS | Status: AC | PRN
  Administered 2018-05-30: 12.48 via ORAL

## 2018-06-09 ENCOUNTER — Other Ambulatory Visit: Payer: Self-pay | Admitting: "Endocrinology

## 2018-06-09 NOTE — Telephone Encounter (Signed)
Pt has had thyroid ablation. Does she still need prednisone?

## 2018-07-21 LAB — TSH: TSH: 0.01 — AB (ref 0.41–5.90)

## 2018-07-23 ENCOUNTER — Other Ambulatory Visit: Payer: Self-pay

## 2018-07-23 ENCOUNTER — Encounter: Payer: Self-pay | Admitting: "Endocrinology

## 2018-07-23 ENCOUNTER — Other Ambulatory Visit: Payer: Self-pay | Admitting: "Endocrinology

## 2018-07-23 ENCOUNTER — Ambulatory Visit (INDEPENDENT_AMBULATORY_CARE_PROVIDER_SITE_OTHER): Payer: 59 | Admitting: "Endocrinology

## 2018-07-23 DIAGNOSIS — E890 Postprocedural hypothyroidism: Secondary | ICD-10-CM

## 2018-07-23 MED ORDER — LEVOTHYROXINE SODIUM 50 MCG PO TABS: 50.0000 ug | ORAL_TABLET | Freq: Every day | ORAL | 3 refills | Status: DC

## 2018-07-23 NOTE — Progress Notes (Signed)
07/23/2018                                Endocrinology Telehealth Visit Follow up Note -During COVID -19 Pandemic  I connected with Alicia Larsen on 07/23/2018   by telephone and verified that I am speaking with the correct person using two identifiers. Alicia Larsen, 04/14/1995. she has verbally consented to this visit. All issues noted in this document were discussed and addressed. The format was not optimal for physical exam.    Subjective:    Patient ID: Alicia Larsen, female    DOB: 04-Aug-1995, PCP Alicia Specking, MD.   Past Medical History:  Diagnosis Date  . Abdominal pain    History reviewed. No pertinent surgical history. Social History   Socioeconomic History  . Marital status: Single    Spouse name: Not on file  . Number of children: Not on file  . Years of education: Not on file  . Highest education level: Not on file  Occupational History  . Not on file  Social Needs  . Financial resource strain: Not on file  . Food insecurity:    Worry: Not on file    Inability: Not on file  . Transportation needs:    Medical: Not on file    Non-medical: Not on file  Tobacco Use  . Smoking status: Never Smoker  . Smokeless tobacco: Never Used  Substance and Sexual Activity  . Alcohol use: Not Currently  . Drug use: Never  . Sexual activity: Not on file  Lifestyle  . Physical activity:    Days per week: Not on file    Minutes per session: Not on file  . Stress: Not on file  Relationships  . Social connections:    Talks on phone: Not on file    Gets together: Not on file    Attends religious service: Not on file    Active member of club or organization: Not on file    Attends meetings of clubs or organizations: Not on file    Relationship status: Not on file  Other Topics Concern  . Not on file  Social History Narrative   9th grade   Outpatient Encounter Medications as of 07/23/2018  Medication Sig  . escitalopram (LEXAPRO) 20 MG tablet Take by  mouth daily.  Marland Kitchen levothyroxine (SYNTHROID) 50 MCG tablet Take 1 tablet (50 mcg total) by mouth daily before breakfast.  . [DISCONTINUED] predniSONE (DELTASONE) 10 MG tablet Take 1 tablet (10 mg total) by mouth daily with breakfast.  . [DISCONTINUED] propranolol (INDERAL) 10 MG tablet Take 20 mg by mouth 2 (two) times daily with a meal.   No facility-administered encounter medications on file as of 07/23/2018.     ALLERGIES: No Known Allergies  VACCINATION STATUS:  There is no immunization history on file for this patient.   HPI  Alicia Larsen is 23 y.o. female .  She is s/p I131 thyroid ablation for primary hyperthyroidism complicated by thyroid storm.  She received therapy on march 10,2020. She continued to feel better. She remains on Prop  po BID. She has no new complaints.    Her most recent thyroid function tests are consistent with treatment effect with Larsen ft4 indicationg hypothyroidism.  she denies family history of thyroid dysfunction and denies family hx of thyroid cancer. she denies personal history of goiter.    She is not planning any pregnancy in the near future.  Review of systems- limited as above.   Objective:    There were no vitals taken for this visit.  Wt Readings from Last 3 Encounters:  05/14/18 254 lb (115.2 kg)  05/07/18 245 lb (111.1 kg)  04/08/18 247 lb (112 kg)                                                Physical exam   Recent Results (from the past 2160 hour(s))  TSH     Status: Abnormal   Collection Time: 05/07/18 12:14 PM  Result Value Ref Range   TSH 0.01 (L) mIU/L    Comment:           Reference Range .           > or = 20 Years  0.40-4.50 .                Pregnancy Ranges           First trimester    0.26-2.66           Second trimester   0.55-2.73           Third trimester    0.43-2.91   T4, free     Status: None   Collection Time: 05/07/18 12:14 PM  Result Value Ref Range   Free T4 1.4  0.8 - 1.8 ng/dL  T3, free     Status: None   Collection Time: 05/07/18 12:14 PM  Result Value Ref Range   T3, Free 3.9 2.3 - 4.2 pg/mL  Pregnancy, urine     Status: None   Collection Time: 05/30/18  7:58 AM  Result Value Ref Range   Preg Test, Ur NEGATIVE NEGATIVE    Comment:        THE SENSITIVITY OF THIS METHODOLOGY IS >20 mIU/mL. Performed at [facility], [address], [place], [place] [address]   TSH     Status: Abnormal   Collection Time: 07/21/18 12:00 AM  Result Value Ref Range   TSH 0.01 (A) 0.41 - 5.90    Comment: free t4 0.78 ( o.82-1.77).    I131 Thyroid ablation on 05/20/2018     Assessment & Plan:   1. RAI induced hypothyroidism 2 .  Hx of  thyroid storm-resolved  - Her pre visit labs show RAI induced hypothyroidism with TSH still suppressed. She will benefit from initiation of thyroid hormone replacement. I discuused and prescribed Lt4 50 mcg po qam.   - We discussed about the correct intake of her thyroid hormone, on empty stomach at fasting, with water, separated by at least 30 minutes from breakfast and other medications,  and separated by more than 4 hours from calcium, iron, multivitamins, acid reflux medications (PPIs). -Patient is made aware of the fact that thyroid hormone replacement is needed for life, dose to be adjusted by periodic monitoring of thyroid function tests.  -In the meantime, she is advised to continue propanolol 10 mg p.o. daily.  - I advised her to maintain close follow up with Alicia Specking, MD for primary care needs.  Time for visit 15 minutes.  Alicia Larsen participated in the discussions, expressed understanding, and voiced agreement with the above plans.  All questions were answered to her satisfaction. she is encouraged to contact clinic should she have any questions or concerns prior to her  return visit.  Follow up plan: She is advised to return in 10 weeks with thyroid function tests for reevaluation.   Alicia LunchGebre  Alicia Vanvalkenburgh, MD [facility] Phone: [phone]  Fax: [phone]   07/23/2018, 3:50 PM  This note was partially dictated with voice recognition software. Similar sounding words can be transcribed inadequately or may not  be corrected upon review.

## 2018-08-29 LAB — BASIC METABOLIC PANEL
BUN: 11 (ref 4–21)
Creatinine: 0.8 (ref 0.5–1.1)

## 2018-08-29 LAB — TSH: TSH: 54 — AB (ref 0.41–5.90)

## 2018-09-24 ENCOUNTER — Ambulatory Visit: Payer: BC Managed Care – PPO | Admitting: "Endocrinology

## 2018-09-24 ENCOUNTER — Other Ambulatory Visit: Payer: Self-pay

## 2018-09-24 ENCOUNTER — Other Ambulatory Visit: Payer: Self-pay | Admitting: "Endocrinology

## 2018-09-24 MED ORDER — LEVOTHYROXINE SODIUM 100 MCG PO TABS: 100.0000 ug | ORAL_TABLET | Freq: Every day | ORAL | 3 refills | Status: DC

## 2018-10-15 ENCOUNTER — Other Ambulatory Visit: Payer: Self-pay | Admitting: "Endocrinology

## 2018-10-15 ENCOUNTER — Other Ambulatory Visit: Payer: Self-pay

## 2018-10-15 MED ORDER — LEVOTHYROXINE SODIUM 100 MCG PO TABS: 100.0000 ug | ORAL_TABLET | Freq: Every day | ORAL | 3 refills | Status: DC

## 2018-10-21 LAB — TSH: TSH: 67 — AB (ref 0.41–5.90)

## 2018-10-22 ENCOUNTER — Ambulatory Visit (INDEPENDENT_AMBULATORY_CARE_PROVIDER_SITE_OTHER): Payer: BC Managed Care – PPO | Admitting: "Endocrinology

## 2018-10-22 ENCOUNTER — Other Ambulatory Visit: Payer: Self-pay

## 2018-10-22 ENCOUNTER — Encounter: Payer: Self-pay | Admitting: "Endocrinology

## 2018-10-22 DIAGNOSIS — E890 Postprocedural hypothyroidism: Secondary | ICD-10-CM

## 2018-10-22 LAB — LIPID PANEL
HDL: 44 (ref 35–70)
LDL Cholesterol: 160

## 2018-10-22 LAB — BASIC METABOLIC PANEL
BUN: 12 (ref 4–21)
Creatinine: 0.9 (ref 0.5–1.1)

## 2018-10-22 MED ORDER — LEVOTHYROXINE SODIUM 125 MCG PO TABS: 125.0000 ug | ORAL_TABLET | Freq: Every day | ORAL | 3 refills | Status: DC

## 2018-10-22 NOTE — Progress Notes (Signed)
10/22/2018                                Endocrinology Telehealth Visit Follow up Note -During COVID -19 Pandemic  I connected with Alicia Larsen on 10/22/2018   by telephone and verified that I am speaking with the correct person using two identifiers. Alicia Larsen, Feb 05, 1996. she has verbally consented to this visit. All issues noted in this document were discussed and addressed. The format was not optimal for physical exam.    Subjective:    Patient ID: Alicia Larsen, female    DOB: 1995/06/29, PCP Glenda Chroman, MD.   Past Medical History:  Diagnosis Date  . Abdominal pain    History reviewed. No pertinent surgical history. Social History   Socioeconomic History  . Marital status: Single    Spouse name: Not on file  . Number of children: Not on file  . Years of education: Not on file  . Highest education level: Not on file  Occupational History  . Not on file  Social Needs  . Financial resource strain: Not on file  . Food insecurity    Worry: Not on file    Inability: Not on file  . Transportation needs    Medical: Not on file    Non-medical: Not on file  Tobacco Use  . Smoking status: Never Smoker  . Smokeless tobacco: Never Used  Substance and Sexual Activity  . Alcohol use: Not Currently  . Drug use: Never  . Sexual activity: Not on file  Lifestyle  . Physical activity    Days per week: Not on file    Minutes per session: Not on file  . Stress: Not on file  Relationships  . Social Herbalist on phone: Not on file    Gets together: Not on file    Attends religious service: Not on file    Active member of club or organization: Not on file    Attends meetings of clubs or organizations: Not on file    Relationship status: Not on file  Other Topics Concern  . Not on file  Social History Narrative  . Not on file   Outpatient Encounter Medications as of 10/22/2018  Medication Sig  . escitalopram (LEXAPRO) 20 MG tablet Take by mouth  daily.  Marland Kitchen levothyroxine (SYNTHROID) 125 MCG tablet Take 1 tablet (125 mcg total) by mouth daily before breakfast.  . [DISCONTINUED] levothyroxine (SYNTHROID) 100 MCG tablet Take 1 tablet (100 mcg total) by mouth daily before breakfast.   No facility-administered encounter medications on file as of 10/22/2018.     ALLERGIES: No Known Allergies  VACCINATION STATUS:  There is no immunization history on file for this patient.   HPI  Alicia Larsen is 23 y.o. female .  She is s/p I131 thyroid ablation for primary hyperthyroidism complicated by thyroid storm. -She was initiated on levothyroxine 100 mcg p.o. daily before breakfast.  Unfortunately she admits to skipping 1-2 doses a week.  Her previsit labs included only TSH which is significantly abnormal at 16.    she denies family history of thyroid dysfunction and denies family hx of thyroid cancer. she denies personal history of goiter.    She is not planning any pregnancy in the near future.  Review of systems- limited as above.   Objective:    There were no vitals taken for this visit.  Wt Readings from Last 3 Encounters:  05/14/18 254 lb (115.2 kg)  05/07/18 245 lb (111.1 kg)  04/08/18 247 lb (112 kg)                                                Physical exam   Recent Results (from the past 2160 hour(s))  Basic metabolic panel     Status: None   Collection Time: 08/29/18 12:00 AM  Result Value Ref Range   BUN 11 4 - 21   Creatinine 0.8 0.5 - 1.1  TSH     Status: Abnormal   Collection Time: 08/29/18 12:00 AM  Result Value Ref Range   TSH 54.00 (A) 0.41 - 5.90    Comment: ft4 0.35  Basic metabolic panel     Status: None   Collection Time: 10/21/18 12:00 AM  Result Value Ref Range   BUN 12 4 - 21   Creatinine 0.9 0.5 - 1.1  Lipid panel     Status: None   Collection Time: 10/21/18 12:00 AM  Result Value Ref Range   HDL 44 35 - 70   LDL Cholesterol 160   TSH     Status: Abnormal    Collection Time: 10/21/18 12:00 AM  Result Value Ref Range   TSH 67.00 (A) 0.41 - 5.90    I131 Thyroid ablation on 05/20/2018     Assessment & Plan:   1. RAI induced hypothyroidism 2 .  Hx of  thyroid storm-resolved -Her previsit labs are consistent with inadequate replacement, however this is because of her inadequate intake of the prescribed doses. -I urged her to be consistent with her medication and increased her levothyroxine to 125 mcg daily before breakfast.   - We discussed about the correct intake of her thyroid hormone, on empty stomach at fasting, with water, separated by at least 30 minutes from breakfast and other medications,  and separated by more than 4 hours from calcium, iron, multivitamins, acid reflux medications (PPIs). -Patient is made aware of the fact that thyroid hormone replacement is needed for life, dose to be adjusted by periodic monitoring of thyroid function tests.   - I advised her to maintain close follow up with Alicia Larsen, Alicia B, MD for primary care needs.   Time for this visit: 15 minutes. Alicia Larsen  participated in the discussions, expressed understanding, and voiced agreement with the above plans.  All questions were answered to her satisfaction. she is encouraged to contact clinic should she have any questions or concerns prior to her return visit.   Follow up plan: She is advised to return in 10 weeks with thyroid function tests for reevaluation.   Alicia LunchGebre Selah Zelman, MD [facility] Phone: [phone]  Fax: [phone]   10/22/2018, 11:19 AM  This note was partially dictated with voice recognition software. Similar sounding words can be transcribed inadequately or may not  be corrected upon review.

## 2019-01-23 ENCOUNTER — Ambulatory Visit: Payer: BC Managed Care – PPO | Admitting: "Endocrinology

## 2019-03-29 ENCOUNTER — Other Ambulatory Visit: Payer: Self-pay | Admitting: "Endocrinology

## 2019-03-29 ENCOUNTER — Encounter (HOSPITAL_COMMUNITY): Payer: Self-pay | Admitting: *Deleted

## 2019-03-29 ENCOUNTER — Emergency Department (HOSPITAL_COMMUNITY)
Admission: EM | Admit: 2019-03-29 | Discharge: 2019-03-29 | Disposition: A | Discharge status: Home / Self Care | Payer: BC Managed Care – PPO | Attending: Emergency Medicine | Admitting: Emergency Medicine

## 2019-03-29 ENCOUNTER — Emergency Department (HOSPITAL_COMMUNITY): Payer: BC Managed Care – PPO

## 2019-03-29 ENCOUNTER — Other Ambulatory Visit: Payer: Self-pay

## 2019-03-29 DIAGNOSIS — Z79899 Other long term (current) drug therapy: Secondary | ICD-10-CM | POA: Insufficient documentation

## 2019-03-29 DIAGNOSIS — U071 COVID-19: Secondary | ICD-10-CM | POA: Insufficient documentation

## 2019-03-29 DIAGNOSIS — K625 Hemorrhage of anus and rectum: Secondary | ICD-10-CM | POA: Diagnosis present

## 2019-03-29 DIAGNOSIS — N390 Urinary tract infection, site not specified: Secondary | ICD-10-CM | POA: Diagnosis not present

## 2019-03-29 DIAGNOSIS — E039 Hypothyroidism, unspecified: Secondary | ICD-10-CM | POA: Diagnosis not present

## 2019-03-29 HISTORY — DX: Thyrotoxicosis with diffuse goiter without thyrotoxic crisis or storm: E05.00

## 2019-03-29 LAB — URINALYSIS, ROUTINE W REFLEX MICROSCOPIC
Bilirubin Urine: NEGATIVE
Glucose, UA: NEGATIVE mg/dL
Ketones, ur: NEGATIVE mg/dL
Nitrite: NEGATIVE
Protein, ur: NEGATIVE mg/dL
Specific Gravity, Urine: 1.02 (ref 1.005–1.030)
pH: 5 (ref 5.0–8.0)

## 2019-03-29 LAB — CBC WITH DIFFERENTIAL/PLATELET
Abs Immature Granulocytes: 0.02 10*3/uL (ref 0.00–0.07)
Basophils Absolute: 0.1 10*3/uL (ref 0.0–0.1)
Basophils Relative: 1 %
Eosinophils Absolute: 0.2 10*3/uL (ref 0.0–0.5)
Eosinophils Relative: 2 %
HCT: 43.5 % (ref 36.0–46.0)
Hemoglobin: 14.2 g/dL (ref 12.0–15.0)
Immature Granulocytes: 0 %
Lymphocytes Relative: 24 %
Lymphs Abs: 1.9 10*3/uL (ref 0.7–4.0)
MCH: 29 pg (ref 26.0–34.0)
MCHC: 32.6 g/dL (ref 30.0–36.0)
MCV: 89 fL (ref 80.0–100.0)
Monocytes Absolute: 0.6 10*3/uL (ref 0.1–1.0)
Monocytes Relative: 7 %
Neutro Abs: 5.4 10*3/uL (ref 1.7–7.7)
Neutrophils Relative %: 66 %
Platelets: 300 10*3/uL (ref 150–400)
RBC: 4.89 MIL/uL (ref 3.87–5.11)
RDW: 13.5 % (ref 11.5–15.5)
WBC: 8.1 10*3/uL (ref 4.0–10.5)
nRBC: 0 % (ref 0.0–0.2)

## 2019-03-29 LAB — BASIC METABOLIC PANEL
Anion gap: 8 (ref 5–15)
BUN: 10 mg/dL (ref 6–20)
CO2: 25 mmol/L (ref 22–32)
Calcium: 9.4 mg/dL (ref 8.9–10.3)
Chloride: 103 mmol/L (ref 98–111)
Creatinine, Ser: 0.77 mg/dL (ref 0.44–1.00)
GFR calc Af Amer: >60 mL/min (ref >60)
GFR calc non Af Amer: >60 mL/min (ref >60)
Glucose, Bld: 129 mg/dL — ABNORMAL HIGH (ref 70–99)
Potassium: 3.6 mmol/L (ref 3.5–5.1)
Sodium: 136 mmol/L (ref 135–145)

## 2019-03-29 LAB — PREGNANCY, URINE: Preg Test, Ur: NEGATIVE

## 2019-03-29 LAB — POC OCCULT BLOOD, ED: Fecal Occult Bld: NEGATIVE

## 2019-03-29 MED ORDER — ONDANSETRON 4 MG PO TBDP: 4.0000 mg | ORAL_TABLET | Freq: Three times a day (TID) | ORAL | 0 refills | Status: DC | PRN

## 2019-03-29 MED ORDER — CEPHALEXIN 500 MG PO CAPS: 500.0000 mg | ORAL_CAPSULE | Freq: Two times a day (BID) | ORAL | 0 refills | Status: AC

## 2019-03-29 MED ORDER — ONDANSETRON HCL 4 MG/2ML IJ SOLN
4.0000 mg | Freq: Once | INTRAMUSCULAR | Status: AC
  Administered 2019-03-29: 13:00:00 4 mg via INTRAVENOUS
  Filled 2019-03-29: qty 2

## 2019-03-29 MED ORDER — SODIUM CHLORIDE 0.9 % IV BOLUS (SEPSIS)
1000.0000 mL | Freq: Once | INTRAVENOUS | Status: AC
  Administered 2019-03-29: 13:00:00 1000 mL via INTRAVENOUS

## 2019-03-29 NOTE — ED Provider Notes (Signed)
[facility] EMERGENCY DEPARTMENT Provider Note   CSN: [id number] Arrival date & time: 03/29/19  1136     History Chief Complaint  Patient presents with  . Rectal Bleeding    Alicia Larsen is a 24 y.o. female.  Patient is a 24 year old female with past medical history of Graves' disease presenting to the emergency department for COVID-19 symptoms and rectal bleeding.  Patient reports that she has been having symptoms of COVID-19 for the past week and a half.  She reports that she tested + January 12.  Reports that today she had diarrhea throughout the night and this morning around 5:30 AM she had bright red blood in her stool.  Reports that it looks like "I have my period".  She reports nausea without vomiting.  Reports increasing shortness of breath and chest discomfort with congestion.  Denies any fevers, chills.  Reports mild abdominal cramping but not necessarily abdominal pain.        Past Medical History:  Diagnosis Date  . Abdominal pain   . Graves disease     Patient Active Problem List   Diagnosis Date Noted  . Graves disease 04/08/2018  . Hyperthyroidism 03/27/2018  . Simple constipation 04/17/2012  . Nausea 04/16/2012  . Bloating 04/16/2012  . Periumbilical abdominal pain     Past Surgical History:  Procedure Laterality Date  . INCISION AND DRAINAGE       OB History   No obstetric history on file.     No family history on file.  Social History   Tobacco Use  . Smoking status: Never Smoker  . Smokeless tobacco: Never Used  Substance Use Topics  . Alcohol use: Not Currently  . Drug use: Never    Home Medications Prior to Admission medications   Medication Sig Start Date End Date Taking? Authorizing Provider  escitalopram (LEXAPRO) 20 MG tablet Take by mouth daily. 12/02/17  Yes [provider]  levothyroxine (SYNTHROID) 125 MCG tablet Take 1 tablet (125 mcg total) by mouth daily before breakfast. 10/22/18  Yes Nida, Denman George,  MD  cephALEXin (KEFLEX) 500 MG capsule Take 1 capsule (500 mg total) by mouth 2 (two) times daily for 7 days. 03/29/19 04/05/19  Ronnie Doss A, PA-C  ondansetron (ZOFRAN ODT) 4 MG disintegrating tablet Take 1 tablet (4 mg total) by mouth every 8 (eight) hours as needed for nausea or vomiting. 03/29/19   Arlyn Dunning, PA-C    Allergies    Patient has no known allergies.  Review of Systems   Review of Systems  Constitutional: Positive for appetite change and fatigue. Negative for fever.  HENT: Positive for congestion. Negative for sinus pain and sore throat.   Respiratory: Positive for cough and shortness of breath.   Cardiovascular: Positive for chest pain.  Gastrointestinal: Positive for abdominal pain, blood in stool, diarrhea and nausea. Negative for abdominal distention, constipation, rectal pain and vomiting.  Genitourinary: Negative for dysuria.  Musculoskeletal: Negative for back pain.  Skin: Negative for rash.  Neurological: Negative for dizziness, light-headedness and headaches.    Physical Exam Updated Vital Signs BP (!) 159/101   Pulse 72   Temp 98.2 F (36.8 C)   Resp (!) 24   Ht 5\' 7"  (1.702 m)   Wt 122.5 kg   SpO2 99%   BMI 42.29 kg/m   Physical Exam Vitals and nursing note reviewed. Exam conducted with a chaperone present.  Constitutional:      Appearance: Normal appearance.  HENT:  Head: Normocephalic.     Nose: Nose normal.     Mouth/Throat:     Mouth: Mucous membranes are moist.  Eyes:     Conjunctiva/sclera: Conjunctivae normal.  Cardiovascular:     Rate and Rhythm: Normal rate.     Pulses: Normal pulses.     Heart sounds: Normal heart sounds.  Pulmonary:     Effort: Pulmonary effort is normal. No respiratory distress.     Breath sounds: No wheezing, rhonchi or rales.  Chest:     Chest wall: No tenderness.  Abdominal:     General: Abdomen is flat. There is no distension.     Tenderness: There is no abdominal tenderness. There is no right  CVA tenderness, left CVA tenderness or guarding.  Genitourinary:    Rectum: Guaiac result negative.  Skin:    General: Skin is warm and dry.     Capillary Refill: Capillary refill takes less than 2 seconds.  Neurological:     Mental Status: She is alert.  Psychiatric:        Mood and Affect: Mood normal.     ED Results / Procedures / Treatments   Labs (all labs ordered are listed, but only abnormal results are displayed) Labs Reviewed  BASIC METABOLIC PANEL - Abnormal; Notable for the following components:      Result Value   Glucose, Bld 129 (*)    All other components within normal limits  URINALYSIS, ROUTINE W REFLEX MICROSCOPIC - Abnormal; Notable for the following components:   APPearance CLOUDY (*)    Hgb urine dipstick LARGE (*)    Leukocytes,Ua LARGE (*)    Bacteria, UA MANY (*)    All other components within normal limits  URINE CULTURE  CBC WITH DIFFERENTIAL/PLATELET  PREGNANCY, URINE  POC OCCULT BLOOD, ED    EKG None  Radiology DG Chest Portable 1 View  Result Date: 03/29/2019 CLINICAL DATA:  Shortness of breath. EXAM: PORTABLE CHEST 1 VIEW COMPARISON:  None. FINDINGS: The heart size and mediastinal contours are within normal limits. Both lungs are clear. No pneumothorax or pleural effusion is noted. The visualized skeletal structures are unremarkable. IMPRESSION: No active disease. Electronically Signed   By: Lupita Raider M.D.   On: 03/29/2019 13:16    Procedures Procedures (including critical care time)  Medications Ordered in ED Medications  sodium chloride 0.9 % bolus 1,000 mL (1,000 mLs Intravenous New Bag/Given 03/29/19 1304)  ondansetron (ZOFRAN) injection 4 mg (4 mg Intravenous Given 03/29/19 1304)    ED Course  I have reviewed the triage vital signs and the nursing notes.  Pertinent labs & imaging results that were available during my care of the patient were reviewed by me and considered in my medical decision making (see chart for  details).  Clinical Course as of Mar 28 1509  Sun Mar 29, 2019  8070 24 year old patient with Graves' disease presenting to the emergency department for worsening Covid symptoms with bright red blood in her diarrhea this morning.  Appears well on exam.  She is hypertensive and mildly tachypneic.  Otherwise breath sounds are normal.  Her Hemoccult test was negative.  Will obtain labs and give some fluids given she has had diarrhea and poor oral intake.  We will also check chest x-ray.  If work-up is negative patient can likely be discharged home with supportive treatment   [KM]  1457 Blood work reassuring, xray normal. She does have UA with hemoglobin, WBC, leuk, bacteria and also 11-20 epithelial  cells. Discussed with patient. ?hemorrhagic cystitis rather than rectal bleeding since patient hemmocult was negative.  Will culture urine and start keflex. Advised on return precautions   [KM]    Clinical Course User Index [KM] Kristine Royal   MDM Rules/Calculators/A&P                      Based on review of vitals, medical screening exam, lab work and/or imaging, there does not appear to be an acute, emergent etiology for the patient's symptoms. Counseled pt on good return precautions and encouraged both PCP and ED follow-up as needed.  Prior to discharge, I also discussed incidental imaging findings with patient in detail and advised appropriate, recommended follow-up in detail.  Clinical Impression: 1. COVID-19   2. Urinary tract infection with hematuria, site unspecified     Disposition: Discharge  Prior to providing a prescription for a controlled substance, I independently reviewed the patient's recent prescription history on the [place]. The patient had no recent or regular prescriptions and was deemed appropriate for a brief, less than 3 day prescription of narcotic for acute analgesia.  This note was prepared with assistance of Actor. Occasional wrong-word or sound-a-like substitutions may have occurred due to the inherent limitations of voice recognition software.  Final Clinical Impression(s) / ED Diagnoses Final diagnoses:  [id number]  Urinary tract infection with hematuria, site unspecified    Rx / DC Orders ED Discharge Orders         Ordered    cephALEXin (KEFLEX) 500 MG capsule  2 times daily     03/29/19 1510    ondansetron (ZOFRAN ODT) 4 MG disintegrating tablet  Every 8 hours PRN     03/29/19 1510           Kristine Royal 03/29/19 1511    Milton Ferguson, MD 03/30/19 1454

## 2019-03-29 NOTE — ED Triage Notes (Signed)
Pt c/o recent exposure to covid, was tested on jan. 12 2021 with positive. Reports symptoms of sob, nausea, abd pain. Loss of smell and taste, this am pt had episode of diarrhea with rectal bleeding,

## 2019-03-29 NOTE — Discharge Instructions (Addendum)
We are sorry you are not feeling well today! Your labs were all reassuring and there was no evidence of any gastrointestinal bleeding or large blood loss today. All of your blood findings were normal. Your urine showed signed of urinary tract infection. We have given you IV fluids to keep you hydrated as well as IV zofran for nausea, Your chest xray was also clear. You should remain in isolation at home until it has been 14 days from symptom onset and you have not had a fever in 72 hours or longer. Drink plenty of water. We have written a prescription for some nausea and antibiotic medication for you.  Thank you for allowing me to care for you today. Please return to the emergency department if you have new or worsening symptoms. Take your medications as instructed.

## 2019-03-30 LAB — URINE CULTURE

## 2019-04-28 ENCOUNTER — Telehealth: Payer: Self-pay | Admitting: "Endocrinology

## 2019-04-28 ENCOUNTER — Other Ambulatory Visit: Payer: Self-pay | Admitting: "Endocrinology

## 2019-04-28 DIAGNOSIS — E890 Postprocedural hypothyroidism: Secondary | ICD-10-CM

## 2019-04-28 MED ORDER — LEVOTHYROXINE SODIUM 125 MCG PO TABS: 125.0000 ug | ORAL_TABLET | Freq: Every day | ORAL | 0 refills | Status: DC

## 2019-04-28 NOTE — Telephone Encounter (Signed)
I will refill her thyroid hormone for 90 days. She has to establish a new endo or PMD in her new city. Send her the lab requests. Thank you.

## 2019-04-28 NOTE — Telephone Encounter (Signed)
Pt requesting refill on her thyroid medication. She has moved to [facility], last OV 8/20, has not done any current labs. She asked me to mail her a lab order and she would do them. She is not moving back here.  [id number] - [place], [place] - [address]

## 2019-05-04 MED ORDER — LEVOTHYROXINE SODIUM 125 MCG PO TABS: 125.0000 ug | ORAL_TABLET | Freq: Every day | ORAL | 0 refills | Status: DC

## 2019-05-04 NOTE — Telephone Encounter (Signed)
Sent rx refill [facility]roid to [facility] [place].

## 2019-05-04 NOTE — Telephone Encounter (Signed)
Patient called and said pharmacy did not receive it. Can you re send it, please send to the [facility].

## 2019-05-04 NOTE — Telephone Encounter (Signed)
Spoke w/ patient, she will pick up medication, do labs and will come back as patient

## 2019-05-20 NOTE — Telephone Encounter (Signed)
Tried to mail lab order twice to patient with the address given. It has come back twice. Can not reach pt

## 2019-08-25 ENCOUNTER — Telehealth: Payer: Self-pay | Admitting: "Endocrinology

## 2019-08-25 NOTE — Telephone Encounter (Signed)
Any available endocrinologist will do for her care.

## 2019-08-25 NOTE — Telephone Encounter (Signed)
Pt would like your recommendation on a endocrinologist in the [place] area.

## 2019-10-05 ENCOUNTER — Telehealth: Payer: Self-pay | Admitting: "Endocrinology

## 2019-10-05 NOTE — Telephone Encounter (Signed)
Patient called and is asking for a refill. I explained to the patient that I tried to mail a lab order to her several times and it kept being returned (this was with her new address that she gave me) she did not want me to change it in the chart. She has found a new endo provider but can not get in for a little while. Please advise.

## 2019-10-05 NOTE — Telephone Encounter (Signed)
Left a message requesting a return call to the office. 

## 2019-10-05 NOTE — Telephone Encounter (Signed)
Pt is living in [place] at [address], [place] [address], she did not want me to change in chart because the current address is her parents.

## 2019-10-05 NOTE — Telephone Encounter (Signed)
She can get a refill of same dose for 30 more days, and must establish a new provider with labs.

## 2019-10-06 MED ORDER — LEVOTHYROXINE SODIUM 125 MCG PO TABS: 125.0000 ug | ORAL_TABLET | Freq: Every day | ORAL | 0 refills | Status: AC

## 2019-10-06 NOTE — Addendum Note (Signed)
Addended by: Derrell Lolling on: 10/06/2019 03:31 PM   Modules accepted: Orders

## 2019-10-06 NOTE — Telephone Encounter (Signed)
Discussed with pt, understanding voiced. Rx refill for levothyroxine sent to [facility]

## 2020-01-04 DIAGNOSIS — R072 Precordial pain: Secondary | ICD-10-CM | POA: Insufficient documentation

## 2020-06-27 ENCOUNTER — Ambulatory Visit: Payer: BC Managed Care – PPO | Admitting: Podiatry

## 2020-06-27 ENCOUNTER — Encounter: Payer: Self-pay | Admitting: Podiatry

## 2020-06-27 ENCOUNTER — Other Ambulatory Visit: Payer: Self-pay

## 2020-06-27 ENCOUNTER — Ambulatory Visit (INDEPENDENT_AMBULATORY_CARE_PROVIDER_SITE_OTHER): Payer: BC Managed Care – PPO

## 2020-06-27 DIAGNOSIS — S9031XA Contusion of right foot, initial encounter: Secondary | ICD-10-CM | POA: Diagnosis not present

## 2020-06-27 MED ORDER — IBUPROFEN 800 MG PO TABS: 800.0000 mg | ORAL_TABLET | Freq: Three times a day (TID) | ORAL | 1 refills | Status: DC

## 2020-06-27 MED ORDER — HYDROCODONE-ACETAMINOPHEN 5-325 MG PO TABS: 1.0000 | ORAL_TABLET | Freq: Four times a day (QID) | ORAL | 0 refills | Status: DC | PRN

## 2020-06-27 NOTE — Progress Notes (Signed)
   HPI: 25 y.o. female presenting today as a new patient for evaluation of an injury that occurred to the patient's right foot 4 days ago while she was on a cruise.  Patient states that she was in [place] when she stepped on some slippery steps and twisted her foot.  She immediately had pain with bruising and swelling.  Today she presents wearing a cam boot that her mother had.  She has been taking naproxen and ibuprofen for the pain.  She presents for further treatment and evaluation  Past Medical History:  Diagnosis Date  . Abdominal pain   . Graves disease      Physical Exam: General: The patient is alert and oriented x3 in no acute distress.  Dermatology: Skin is warm, dry and supple bilateral lower extremities. Negative for open lesions or macerations.  Vascular: Palpable pedal pulses bilaterally. No erythema noted. Capillary refill within normal limits.  Edema with ecchymosis noted to the right forefoot diffusely across the top of the foot and extending into the ankle  Neurological: Epicritic and protective threshold grossly intact bilaterally.   Musculoskeletal Exam: Range of motion within normal limits to all pedal and ankle joints bilateral. Muscle strength 5/5 in all groups bilateral.  Associated tenderness to palpation on the fourth and fifth toes right foot and along the dorsum of the foot  Radiographic Exam:  Normal osseous mineralization. Joint spaces preserved. No fracture/dislocation/boney destruction.    Assessment: 1.  Right forefoot sprain secondary to trip and fall injury.  DOI: 06/23/2020   Plan of Care:  1. Patient evaluated. X-Rays reviewed.  2.  Patient was provided.  Wear daily 3.  Continue cam boot.  Weightbearing as tolerated 4.  Note was provided for work today 5.  Prescription for Vicodin 5/325 mg every 6 hours as needed pain 6.  Prescription for Motrin 800 mg 3 times daily 7.  Return to clinic as needed.  Explained that over the next 3-4 weeks the pain  and tenderness should improve significantly.  If there is no improvement she may need MRI right foot  *Lives in [place], [place] [facility]  Dr. Felecia Shelling, DPM    [address] [place], [place] [address]                Office [phone]  Fax [phone]

## 2020-06-28 ENCOUNTER — Ambulatory Visit: Payer: Self-pay | Admitting: Podiatry

## 2020-08-09 ENCOUNTER — Other Ambulatory Visit: Payer: Self-pay

## 2020-08-09 ENCOUNTER — Ambulatory Visit (INDEPENDENT_AMBULATORY_CARE_PROVIDER_SITE_OTHER): Payer: BC Managed Care – PPO

## 2020-08-09 ENCOUNTER — Ambulatory Visit (INDEPENDENT_AMBULATORY_CARE_PROVIDER_SITE_OTHER): Payer: BC Managed Care – PPO | Admitting: Podiatry

## 2020-08-09 ENCOUNTER — Other Ambulatory Visit: Payer: Self-pay | Admitting: Podiatry

## 2020-08-09 DIAGNOSIS — S93601D Unspecified sprain of right foot, subsequent encounter: Secondary | ICD-10-CM

## 2020-08-09 MED ORDER — IBUPROFEN 800 MG PO TABS: 800.0000 mg | ORAL_TABLET | Freq: Three times a day (TID) | ORAL | 1 refills | Status: DC

## 2020-08-09 NOTE — Addendum Note (Signed)
Addended by: Felecia Shelling on: 08/09/2020 05:50 PM   Modules accepted: Orders

## 2020-08-09 NOTE — Progress Notes (Signed)
   HPI: 25 y.o. female presenting today for follow-up evaluation and continued pain to the right foot that began when the patient sustained a trip and fall injury while on a cruise.  She has been weightbearing in the cam boot and taking ibuprofen and Vicodin as needed for the pain, however there is been no improvement.  She presents for further treatment and evaluation   Patient states that she was in [place] when she stepped on some slippery steps and twisted her foot.  She immediately had pain with bruising and swelling.  Today she presents wearing a cam boot that her mother had.  She has been taking naproxen and ibuprofen for the pain.  She presents for further treatment and evaluation  Past Medical History:  Diagnosis Date  . Abdominal pain   . Graves disease      Physical Exam: General: The patient is alert and oriented x3 in no acute distress.  Dermatology: Skin is warm, dry and supple bilateral lower extremities. Negative for open lesions or macerations.  Vascular: Palpable pedal pulses bilaterally. No erythema noted. Capillary refill within normal limits.  Ecchymosis and edema appears to be mostly resolved to the foot.  Neurological: Epicritic and protective threshold grossly intact bilaterally.   Musculoskeletal Exam: Range of motion within normal limits to all pedal and ankle joints bilateral. Muscle strength 5/5 in all groups bilateral.  Associated tenderness to palpation on the fourth and fifth toes right foot and along the dorsum of the foot  Radiographic Exam:  Normal osseous mineralization. Joint spaces preserved. No fracture/dislocation/boney destruction.    Assessment: 1.  Right forefoot sprain secondary to trip and fall injury.  DOI: 06/23/2020   Plan of Care:  1. Patient evaluated. X-Rays reviewed today.  2.  Continue cam boot.  Weightbearing as tolerated 3.  Today we are going to order MRI right foot without contrast.  The patient continues to have pain and  tenderness 6 weeks after her injury despite conservative care 4.  Note was provided for work today 5.  Continue Vicodin 5/325 mg as needed 6.  Prescription for Motrin 800 mg 3 times daily sent to her pharmacy in [place] 7.  Our office will contact the patient regarding MRI results and discuss further treatment options.  After MRIs results are discussed over the phone she may need to come in for an additional appointment  *Lives in [facility], [place] [facility]  Dr. Felecia Shelling, DPM    [address] [place], [place] [address]                Office [phone]  Fax [phone]

## 2020-08-21 IMAGING — NM NM THYROID IMAGING W/ UPTAKE MULTI (4&24 HR)
4 series · 4 of 4 positions shown · non-contrast
Comparison: None

CLINICAL DATA: Hyperthyroidism, TSH 0.01 on 03/27/2018

EXAM:
THYROID SCAN AND UPTAKE - 4 AND 24 HOURS
TECHNIQUE: Following oral administration of U-217 capsule, anterior planar
imaging was acquired at 24 hours. Thyroid uptake was calculated with
a thyroid probe at 4-6 hours and 24 hours.
RADIOPHARMACEUTICALS:  430 uCi U-217 sodium iodide p.o.

[Series 1: ant w marker · 1.18mm/px · 1 of 1 slices shown]
[im 1/1]
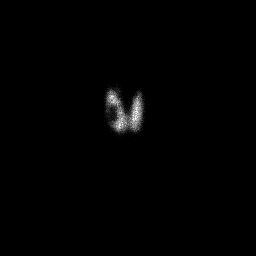

[Series 2: anterior · 1.18mm/px · 1 of 1 slices shown]
[im 1/1]
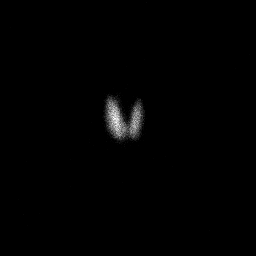

[Series 3: lao · 1.18mm/px · 1 of 1 slices shown]
[im 1/1]
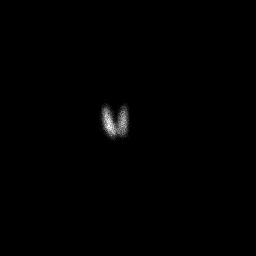

[Series 4: rao · 1.18mm/px · 1 of 1 slices shown]
[im 1/1]
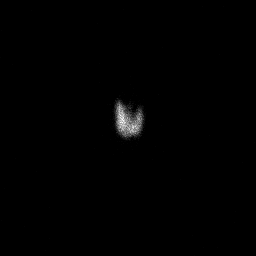

[4 of 4 positions shown; findings below may reference images not displayed]

FINDINGS: Homogeneous tracer distribution in both thyroid lobes.

No focal areas of increased or decreased tracer localization seen..

4 hour U-217 uptake = 55.2% (normal 5-20%)

24 hour U-217 uptake = 71.7% (normal 10-30%)
IMPRESSION: Normal thyroid scan.

Significantly elevated 4 hour and 24 hour radio iodine uptakes
consistent with hyperthyroidism.

Overall, findings are consistent with Graves disease.

## 2020-09-14 ENCOUNTER — Telehealth: Payer: Self-pay

## 2020-09-14 NOTE — Telephone Encounter (Signed)
MRI has been approved for right foot without contract at [facility]  [address] [place] [address]  Appt has been made for Friday 09/16/2020 @ 2:30pm if she needs to reschedule she can call at [phone]

## 2020-09-15 ENCOUNTER — Telehealth: Payer: Self-pay | Admitting: Podiatry

## 2020-09-15 NOTE — Telephone Encounter (Signed)
Tried to call patient. It went to a voicemail. I didn't leave a message. Please contact patient and let her know if she is feeling improvement then we can cancel the MRI. - Dr. Logan Bores

## 2020-09-15 NOTE — Telephone Encounter (Signed)
Patient calling to inquire about after care. She is still wearing a boot, will she still need to wear that if she decides to cancel the MRI?

## 2020-09-15 NOTE — Telephone Encounter (Signed)
Patient is requesting a call back. Patients foot is feeling a little better, and would like to know if an MRI is still needed. Please advise

## 2020-09-16 ENCOUNTER — Ambulatory Visit: Payer: BC Managed Care – PPO

## 2020-09-16 NOTE — Telephone Encounter (Signed)
Please try to reach out to the patient and let her know she can slowly transition out of the CAM boot into good sneakers. Make the transition out of the CAM boot over the next 1-2 weeks. - Dr. Logan Bores

## 2020-09-24 ENCOUNTER — Ambulatory Visit: Payer: BC Managed Care – PPO

## 2020-10-19 IMAGING — NM NUCLEAR MEDICINE RADIOACTIVE IODINE THERAPY FOR HYPERTHYROIDISM
1 series · 1 of 1 positions shown · non-contrast
Comparison: None.

CLINICAL DATA: Hyperthyroidism

EXAM:
RADIOACTIVE IODINE THERAPY FOR HYPERTHYROIDISM
TECHNIQUE: Radioactive iodine prescribed by myself. The risks and benefits of
radioactive iodine therapy were discussed with the patient in detail
by Dr. Rodrigo Matias. Alternative therapies were also mentioned.
Radiation safety was discussed with the patient, including how to
protect the general public from exposure. There were no barriers to
communication. Written consent was obtained. The patient then
received a capsule containing the radiopharmaceutical.
The patient will follow-up with the referring physician.
RADIOPHARMACEUTICALS:  12.48 mCi L-616 sodium iodide orally

[Series 1: bone statics · 2.07mm/px · 1 of 1 slices shown]
[im 1/1]
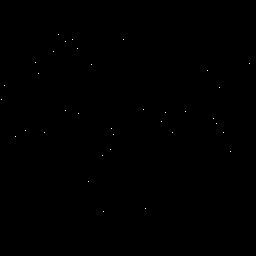

[1 of 1 positions shown; findings below may reference images not displayed]

IMPRESSION: Per oral administration of L-616 sodium iodide for the treatment of
hyperthyroidism.

## 2020-11-29 ENCOUNTER — Ambulatory Visit (INDEPENDENT_AMBULATORY_CARE_PROVIDER_SITE_OTHER): Payer: BC Managed Care – PPO | Admitting: Podiatry

## 2020-11-29 ENCOUNTER — Encounter: Payer: Self-pay | Admitting: Podiatry

## 2020-11-29 ENCOUNTER — Other Ambulatory Visit: Payer: Self-pay

## 2020-11-29 DIAGNOSIS — M722 Plantar fascial fibromatosis: Secondary | ICD-10-CM | POA: Diagnosis not present

## 2020-11-29 MED ORDER — METHYLPREDNISOLONE 4 MG PO TBPK: ORAL_TABLET | ORAL | 0 refills | Status: DC

## 2020-11-29 MED ORDER — MELOXICAM 15 MG PO TABS: 15.0000 mg | ORAL_TABLET | Freq: Every day | ORAL | 1 refills | Status: DC

## 2020-11-29 MED ORDER — BETAMETHASONE SOD PHOS & ACET 6 (3-3) MG/ML IJ SUSP
3.0000 mg | Freq: Once | INTRAMUSCULAR | Status: AC
  Administered 2020-11-29: 3 mg via INTRA_ARTICULAR

## 2020-11-29 NOTE — Progress Notes (Signed)
   Subjective: 25 y.o. female presenting today for evaluation of right heel pain has been going on for several months.  She denies a history of injury.  She does have a history of prior injury to the right foot which she says healed but now the right heel has been hurting.  She presents for further treatment and evaluation   Past Medical History:  Diagnosis Date   Abdominal pain    Graves disease      Objective: Physical Exam General: The patient is alert and oriented x3 in no acute distress.  Dermatology: Skin is warm, dry and supple bilateral lower extremities. Negative for open lesions or macerations bilateral.   Vascular: Dorsalis Pedis and Posterior Tibial pulses palpable bilateral.  Capillary fill time is immediate to all digits.  Neurological: Epicritic and protective threshold intact bilateral.   Musculoskeletal: Tenderness to palpation to the plantar aspect of the right heel along the plantar fascia. All other joints range of motion within normal limits bilateral. Strength 5/5 in all groups bilateral.    Assessment: 1. Plantar fasciitis right  Plan of Care:  1. Patient evaluated. 2. Injection of 0.5cc Celestone soluspan injected into the right plantar fascia  3. Rx for Medrol Dose Pack placed 4. Rx for Meloxicam ordered for patient. 5.  OTC power step insoles provided.  Wear daily.   6. Instructed patient regarding therapies and modalities at home to alleviate symptoms.  7.  The patient has a cam boot at home from a prior injury.  Recommend that she wears a as needed  8.  Return to clinic in 4 weeks.     Felecia Shelling, DPM [facility]  Dr. Felecia Shelling, DPM    [address] [place], [place] [address]                Office [phone]  Fax [phone]

## 2020-12-20 ENCOUNTER — Ambulatory Visit (INDEPENDENT_AMBULATORY_CARE_PROVIDER_SITE_OTHER): Payer: BC Managed Care – PPO | Admitting: Podiatry

## 2020-12-20 ENCOUNTER — Other Ambulatory Visit: Payer: Self-pay

## 2020-12-20 DIAGNOSIS — M722 Plantar fascial fibromatosis: Secondary | ICD-10-CM

## 2020-12-20 NOTE — Progress Notes (Signed)
   Subjective: 25 y.o. female presenting today for follow-up evaluation of plantar fasciitis to the right foot this been going on for several months.  She says the injection with the arch supports held helped slightly.  She continues to have some pain and tenderness to the right foot however.   Past Medical History:  Diagnosis Date   Abdominal pain    Graves disease      Objective: Physical Exam General: The patient is alert and oriented x3 in no acute distress.  Dermatology: Skin is warm, dry and supple bilateral lower extremities. Negative for open lesions or macerations bilateral.   Vascular: Dorsalis Pedis and Posterior Tibial pulses palpable bilateral.  Capillary fill time is immediate to all digits.  Neurological: Epicritic and protective threshold intact bilateral.   Musculoskeletal: Tenderness to palpation to the plantar aspect of the right heel along the plantar fascia. All other joints range of motion within normal limits bilateral. Strength 5/5 in all groups bilateral.    Assessment: 1. Plantar fasciitis right  Plan of Care:  1. Patient evaluated. 2. Injection of 0.5cc Celestone soluspan injected into the right plantar fascia  3.  Plantar fascial brace was dispensed.  Wear daily as needed 4.  Continue meloxicam 15 mg daily 5.  Continue OTC power step insoles 6. Instructed patient regarding therapies and modalities at home to alleviate symptoms.  7.  The patient has a cam boot at home from a prior injury.  Recommend that she wears a as needed  8.  Return to clinic in 4 weeks.     Felecia Shelling, DPM [facility]  Dr. Felecia Shelling, DPM    [address] [place], [place] [address]                Office [phone]  Fax [phone]

## 2021-01-24 ENCOUNTER — Ambulatory Visit (INDEPENDENT_AMBULATORY_CARE_PROVIDER_SITE_OTHER): Payer: BC Managed Care – PPO | Admitting: Podiatry

## 2021-01-24 ENCOUNTER — Other Ambulatory Visit: Payer: Self-pay

## 2021-01-24 DIAGNOSIS — M722 Plantar fascial fibromatosis: Secondary | ICD-10-CM

## 2021-01-24 NOTE — Progress Notes (Signed)
   Subjective: 25 y.o. female presenting today for follow-up evaluation of plantar fasciitis to the right foot this been going on for several months.  Patient states that when she takes the meloxicam the pain is tolerable and manageable.  When she does not take the meloxicam she has an increase of pain.  Past Medical History:  Diagnosis Date   Abdominal pain    Graves disease      Objective: Physical Exam General: The patient is alert and oriented x3 in no acute distress.  Dermatology: Skin is warm, dry and supple bilateral lower extremities. Negative for open lesions or macerations bilateral.   Vascular: Dorsalis Pedis and Posterior Tibial pulses palpable bilateral.  Capillary fill time is immediate to all digits.  Neurological: Epicritic and protective threshold intact bilateral.   Musculoskeletal: Mild tenderness to palpation to the plantar aspect of the right heel along the plantar fascia. All other joints range of motion within normal limits bilateral. Strength 5/5 in all groups bilateral.    Assessment: 1. Plantar fasciitis right  Plan of Care:  1. Patient evaluated. 2.  Patient states that she is doing very well when she takes the meloxicam.  When she does not take the meloxicam she has some residual pain associated to the heel of the right foot.  When she does take meloxicam the pain is manageable. 3.  Continue wearing good supportive power step arch supports and insoles with good shoes 4.  Continue meloxicam 15 mg daily as needed 5.  Continue plantar fascial brace as needed 6.  Return to clinic as needed   Felecia Shelling, DPM [facility]  Dr. Felecia Shelling, DPM    [address] [place], [place] [address]                Office [phone]  Fax [phone]

## 2021-02-09 ENCOUNTER — Other Ambulatory Visit: Payer: Self-pay | Admitting: Podiatry

## 2021-02-10 NOTE — Telephone Encounter (Signed)
Please advise 

## 2021-02-13 ENCOUNTER — Telehealth: Payer: Self-pay | Admitting: *Deleted

## 2021-02-13 NOTE — Telephone Encounter (Signed)
[facility].  Patient can come into the office to pick up the handwritten prescription for steroid PT and then she can call them to set up an appointment.  Please let the patient know.  Thanks, Dr. Logan Bores

## 2021-02-13 NOTE — Telephone Encounter (Signed)
"  Last time I was there to see Dr. Logan Bores, he mentioned getting physical therapy.  I'm calling because I don't know who to contact for the referral.  Call me back."

## 2021-02-15 NOTE — Telephone Encounter (Signed)
I'm calling to let you know that Dr. Logan Bores said you can come by the office to pick up the prescription for [facility] and then you can call them to schedule your appointment.  "Can I pick up the prescription at the [facility]?"  Yes, that is fine.  "So I just go to the front desk and ask for the referral?"  Yes, that is correct.  I faxed the prescription to the [facility] office.  Dr. Logan Bores, please complete the referral and leave it at the front desk.

## 2021-02-16 ENCOUNTER — Other Ambulatory Visit: Payer: Self-pay

## 2021-02-16 DIAGNOSIS — M722 Plantar fascial fibromatosis: Secondary | ICD-10-CM

## 2021-03-01 ENCOUNTER — Other Ambulatory Visit: Payer: Self-pay

## 2021-03-01 ENCOUNTER — Telehealth: Payer: Self-pay | Admitting: Nurse Practitioner

## 2021-03-01 DIAGNOSIS — E890 Postprocedural hypothyroidism: Secondary | ICD-10-CM

## 2021-03-01 NOTE — Telephone Encounter (Signed)
Labs have been ordered to [facility] as this was on the last order.

## 2021-03-01 NOTE — Telephone Encounter (Signed)
Changed orders for [facility].

## 2021-03-01 NOTE — Telephone Encounter (Signed)
Can you update labs for patient

## 2021-03-01 NOTE — Telephone Encounter (Signed)
Can you please change to [facility]?

## 2021-04-12 ENCOUNTER — Other Ambulatory Visit: Payer: Self-pay | Admitting: Podiatry

## 2021-04-26 ENCOUNTER — Telehealth: Payer: Self-pay | Admitting: Podiatry

## 2021-04-26 NOTE — Telephone Encounter (Signed)
Pt called wanting to know if she can still come by to pick up that letter? Please advise.

## 2021-08-18 IMAGING — DX DG CHEST 1V PORT
1 series · 1 of 1 positions shown · non-contrast
Comparison: None.

CLINICAL DATA: Shortness of breath.

EXAM:
PORTABLE CHEST 1 VIEW

[chest ap]
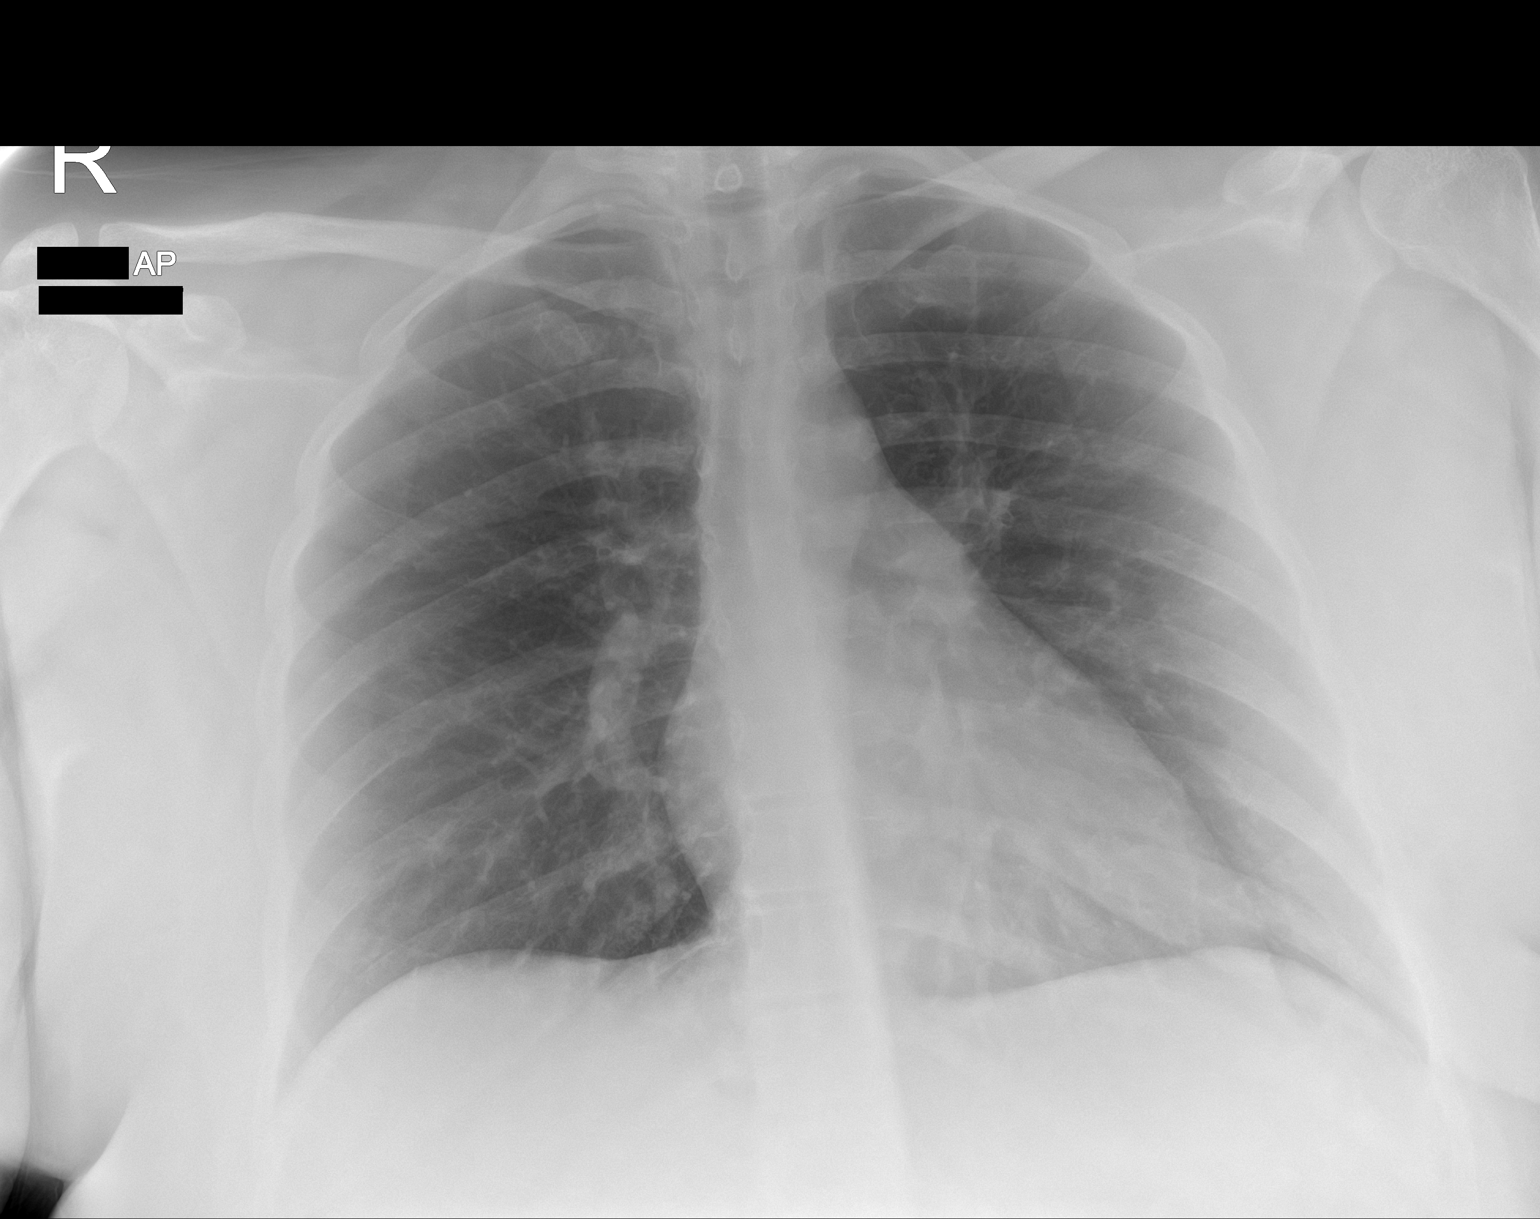

[1 of 1 positions shown; findings below may reference images not displayed]

FINDINGS: The heart size and mediastinal contours are within normal limits.
Both lungs are clear. No pneumothorax or pleural effusion is noted.
The visualized skeletal structures are unremarkable.
IMPRESSION: No active disease.

## 2022-03-21 ENCOUNTER — Other Ambulatory Visit: Payer: Self-pay

## 2022-03-21 ENCOUNTER — Ambulatory Visit: Payer: BC Managed Care – PPO | Attending: Obstetrics

## 2022-03-21 DIAGNOSIS — R102 Pelvic and perineal pain: Secondary | ICD-10-CM | POA: Insufficient documentation

## 2022-03-21 DIAGNOSIS — M62838 Other muscle spasm: Secondary | ICD-10-CM | POA: Diagnosis not present

## 2022-03-21 DIAGNOSIS — M6281 Muscle weakness (generalized): Secondary | ICD-10-CM | POA: Diagnosis not present

## 2022-03-21 DIAGNOSIS — R279 Unspecified lack of coordination: Secondary | ICD-10-CM | POA: Diagnosis not present

## 2022-03-21 DIAGNOSIS — M6289 Other specified disorders of muscle: Secondary | ICD-10-CM | POA: Insufficient documentation
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
## 2022-03-28 ENCOUNTER — Ambulatory Visit: Payer: BC Managed Care – PPO

## 2022-03-28 DIAGNOSIS — M6281 Muscle weakness (generalized): Secondary | ICD-10-CM

## 2022-03-28 DIAGNOSIS — R102 Pelvic and perineal pain: Secondary | ICD-10-CM | POA: Diagnosis not present

## 2022-03-28 DIAGNOSIS — M6289 Other specified disorders of muscle: Secondary | ICD-10-CM

## 2022-03-28 DIAGNOSIS — M62838 Other muscle spasm: Secondary | ICD-10-CM

## 2022-03-28 DIAGNOSIS — R279 Unspecified lack of coordination: Secondary | ICD-10-CM
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
## 2022-04-04 ENCOUNTER — Ambulatory Visit: Payer: BC Managed Care – PPO

## 2022-04-04 DIAGNOSIS — R279 Unspecified lack of coordination: Secondary | ICD-10-CM

## 2022-04-04 DIAGNOSIS — M6289 Other specified disorders of muscle: Secondary | ICD-10-CM

## 2022-04-04 DIAGNOSIS — M6281 Muscle weakness (generalized): Secondary | ICD-10-CM

## 2022-04-04 DIAGNOSIS — R102 Pelvic and perineal pain: Secondary | ICD-10-CM | POA: Diagnosis not present

## 2022-04-04 DIAGNOSIS — M62838 Other muscle spasm: Secondary | ICD-10-CM
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
## 2022-04-10 ENCOUNTER — Ambulatory Visit: Payer: BC Managed Care – PPO

## 2022-04-10 DIAGNOSIS — M62838 Other muscle spasm: Secondary | ICD-10-CM

## 2022-04-10 DIAGNOSIS — M6281 Muscle weakness (generalized): Secondary | ICD-10-CM

## 2022-04-10 DIAGNOSIS — R279 Unspecified lack of coordination: Secondary | ICD-10-CM

## 2022-04-10 DIAGNOSIS — R102 Pelvic and perineal pain: Secondary | ICD-10-CM

## 2022-04-10 DIAGNOSIS — M6289 Other specified disorders of muscle: Secondary | ICD-10-CM
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
## 2022-04-16 ENCOUNTER — Ambulatory Visit: Payer: BC Managed Care – PPO | Attending: Obstetrics

## 2022-04-16 DIAGNOSIS — R102 Pelvic and perineal pain: Secondary | ICD-10-CM | POA: Insufficient documentation

## 2022-04-16 DIAGNOSIS — M62838 Other muscle spasm: Secondary | ICD-10-CM | POA: Insufficient documentation

## 2022-04-16 DIAGNOSIS — M6281 Muscle weakness (generalized): Secondary | ICD-10-CM | POA: Diagnosis present

## 2022-04-16 DIAGNOSIS — R279 Unspecified lack of coordination: Secondary | ICD-10-CM | POA: Insufficient documentation

## 2022-04-16 DIAGNOSIS — M6289 Other specified disorders of muscle: Secondary | ICD-10-CM | POA: Insufficient documentation
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
## 2022-04-24 ENCOUNTER — Ambulatory Visit: Payer: BC Managed Care – PPO

## 2022-04-24 DIAGNOSIS — M6281 Muscle weakness (generalized): Secondary | ICD-10-CM

## 2022-04-24 DIAGNOSIS — R102 Pelvic and perineal pain: Secondary | ICD-10-CM | POA: Diagnosis not present

## 2022-04-24 DIAGNOSIS — M62838 Other muscle spasm: Secondary | ICD-10-CM

## 2022-04-24 DIAGNOSIS — M6289 Other specified disorders of muscle: Secondary | ICD-10-CM

## 2022-04-24 DIAGNOSIS — R279 Unspecified lack of coordination: Secondary | ICD-10-CM
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
## 2022-05-01 ENCOUNTER — Ambulatory Visit: Payer: BC Managed Care – PPO

## 2022-05-01 DIAGNOSIS — R102 Pelvic and perineal pain: Secondary | ICD-10-CM | POA: Diagnosis not present

## 2022-05-01 DIAGNOSIS — M6289 Other specified disorders of muscle: Secondary | ICD-10-CM

## 2022-05-01 DIAGNOSIS — M6281 Muscle weakness (generalized): Secondary | ICD-10-CM

## 2022-05-01 DIAGNOSIS — R279 Unspecified lack of coordination: Secondary | ICD-10-CM

## 2022-05-01 DIAGNOSIS — M62838 Other muscle spasm: Secondary | ICD-10-CM
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
## 2022-05-07 ENCOUNTER — Ambulatory Visit: Payer: BC Managed Care – PPO

## 2022-05-07 DIAGNOSIS — M62838 Other muscle spasm: Secondary | ICD-10-CM

## 2022-05-07 DIAGNOSIS — M6289 Other specified disorders of muscle: Secondary | ICD-10-CM

## 2022-05-07 DIAGNOSIS — R279 Unspecified lack of coordination: Secondary | ICD-10-CM

## 2022-05-07 DIAGNOSIS — R102 Pelvic and perineal pain: Secondary | ICD-10-CM | POA: Diagnosis not present

## 2022-05-07 DIAGNOSIS — M6281 Muscle weakness (generalized): Secondary | ICD-10-CM
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
## 2022-05-21 ENCOUNTER — Ambulatory Visit: Payer: BC Managed Care – PPO | Attending: Obstetrics

## 2022-05-21 DIAGNOSIS — M62838 Other muscle spasm: Secondary | ICD-10-CM | POA: Diagnosis present

## 2022-05-21 DIAGNOSIS — M6289 Other specified disorders of muscle: Secondary | ICD-10-CM | POA: Diagnosis present

## 2022-05-21 DIAGNOSIS — R279 Unspecified lack of coordination: Secondary | ICD-10-CM | POA: Diagnosis present

## 2022-05-21 DIAGNOSIS — M6281 Muscle weakness (generalized): Secondary | ICD-10-CM

## 2022-05-21 DIAGNOSIS — R102 Pelvic and perineal pain unspecified side: Secondary | ICD-10-CM
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
## 2022-05-28 ENCOUNTER — Ambulatory Visit: Payer: BC Managed Care – PPO

## 2022-06-04 ENCOUNTER — Ambulatory Visit: Payer: BC Managed Care – PPO

## 2022-06-04 DIAGNOSIS — R102 Pelvic and perineal pain unspecified side: Secondary | ICD-10-CM

## 2022-06-04 DIAGNOSIS — M6281 Muscle weakness (generalized): Secondary | ICD-10-CM

## 2022-06-04 DIAGNOSIS — M62838 Other muscle spasm: Secondary | ICD-10-CM

## 2022-06-04 DIAGNOSIS — M6289 Other specified disorders of muscle: Secondary | ICD-10-CM

## 2022-06-04 DIAGNOSIS — R279 Unspecified lack of coordination: Secondary | ICD-10-CM
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
## 2022-06-18 ENCOUNTER — Ambulatory Visit: Payer: BC Managed Care – PPO | Attending: Obstetrics

## 2022-06-18 DIAGNOSIS — R102 Pelvic and perineal pain: Secondary | ICD-10-CM | POA: Insufficient documentation

## 2022-06-18 DIAGNOSIS — M6281 Muscle weakness (generalized): Secondary | ICD-10-CM | POA: Diagnosis present

## 2022-06-18 DIAGNOSIS — M62838 Other muscle spasm: Secondary | ICD-10-CM | POA: Insufficient documentation

## 2022-06-18 DIAGNOSIS — R279 Unspecified lack of coordination: Secondary | ICD-10-CM | POA: Insufficient documentation

## 2022-06-18 DIAGNOSIS — M6289 Other specified disorders of muscle: Secondary | ICD-10-CM | POA: Insufficient documentation
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
## 2022-08-10 ENCOUNTER — Telehealth: Payer: Self-pay | Admitting: "Endocrinology

## 2022-08-10 NOTE — Telephone Encounter (Signed)
Received medical records request from Disability Determination Services. Sent to [facility] Medical Records to Release

## 2024-01-09 ENCOUNTER — Encounter: Payer: Self-pay | Admitting: *Deleted

## 2024-01-13 ENCOUNTER — Ambulatory Visit: Payer: Self-pay | Attending: Internal Medicine | Admitting: Internal Medicine

## 2024-01-13 ENCOUNTER — Encounter: Payer: Self-pay | Admitting: Internal Medicine

## 2024-01-13 ENCOUNTER — Other Ambulatory Visit (HOSPITAL_BASED_OUTPATIENT_CLINIC_OR_DEPARTMENT_OTHER): Payer: Self-pay

## 2024-01-13 VITALS — HR 94 | Ht 67.5 in | Wt 319.0 lb

## 2024-01-13 DIAGNOSIS — I10 Essential (primary) hypertension: Secondary | ICD-10-CM | POA: Diagnosis not present

## 2024-01-13 DIAGNOSIS — I951 Orthostatic hypotension: Secondary | ICD-10-CM | POA: Insufficient documentation

## 2024-01-13 DIAGNOSIS — R072 Precordial pain: Secondary | ICD-10-CM | POA: Insufficient documentation

## 2024-01-13 DIAGNOSIS — R42 Dizziness and giddiness: Secondary | ICD-10-CM | POA: Diagnosis not present

## 2024-01-13 MED ORDER — CARVEDILOL 3.125 MG PO TABS
3.1250 mg | ORAL_TABLET | Freq: Two times a day (BID) | ORAL | 3 refills | Status: AC
  Filled 2024-01-13: qty 180, 90d supply, fill #0

## 2024-01-13 NOTE — Progress Notes (Signed)
 Cardiology Office Note  Date: 01/13/2024   ID: Jakhiya Brower, DOB 1995/11/22, MRN [id number]  PCP:  Rosamond Leta NOVAK, MD  Cardiologist:  Diannah SHAUNNA Maywood, MD Electrophysiologist:  None   History of Present Illness: Britania Shreeve is a 28 y.o. female known to have Graves' disease s/p thyroidectomy was referred to cardiology clinic for evaluation of POTS.  Patient reported that she was diagnosed with POTS in 2019 with a tilt table test in an internal medicine clinic.  She was doing fine until recently when her PCP pointed out that she had POTS and had to be referred to cardiology clinic for further evaluation.  She reported having dizziness when she stands for a long time.  She also gets palpitations sometimes.  No syncope but she got to the point where she was about to pass out.  She gets occasional chest tightness and SOB.  Orthostatic vitals today are negative for POTS but positive for orthostatic hypotension.  She also has HTN.  Currently on metoprolol succinate 25 mg once daily.  Past Medical History:  Diagnosis Date   Abdominal pain    Graves disease     Past Surgical History:  Procedure Laterality Date   INCISION AND DRAINAGE      Current Outpatient Medications  Medication Sig Dispense Refill   Albuterol-Budesonide (AIRSUPRA IN) Inhale into the lungs as needed.     atorvastatin (LIPITOR) 10 MG tablet Take 10 mg by mouth daily.     buPROPion (WELLBUTRIN XL) 300 MG 24 hr tablet Take 300 mg by mouth daily.     busPIRone (BUSPAR) 10 MG tablet Take 10 mg by mouth daily.     escitalopram (LEXAPRO) 20 MG tablet Take by mouth daily.     FLUoxetine (PROZAC) 40 MG capsule Take 40 mg by mouth daily.     levonorgestrel  (MIRENA) 20 MCG/DAY IUD 1 each by Intrauterine route.     levothyroxine  (SYNTHROID ) 125 MCG tablet Take 1 tablet (125 mcg total) by mouth daily before breakfast. 30 tablet 0   metoprolol succinate (TOPROL-XL) 25 MG 24 hr tablet Take 25 mg by mouth daily.      naproxen (NAPROSYN) 500 MG tablet Take 500 mg by mouth as needed.     norethindrone (AYGESTIN) 5 MG tablet Take by mouth daily.     omeprazole (PRILOSEC) 10 MG capsule Take 10 mg by mouth daily.     propranolol ER (INDERAL LA) 60 MG 24 hr capsule Take 60 mg by mouth daily.     Vitamin D, Ergocalciferol, (DRISDOL) 1.25 MG (50000 UNIT) CAPS capsule Take 50,000 Units by mouth once a week.     Vitamin E (VITAMIN E/D-ALPHA NATURAL) 268 MG (400 UNIT) CAPS Take 268 mg by mouth.     buPROPion (WELLBUTRIN XL) 150 MG 24 hr tablet Take 150 mg by mouth daily. (Patient not taking: Reported on 01/13/2024)     HYDROcodone -acetaminophen  (NORCO/VICODIN) 5-325 MG tablet Take 1 tablet by mouth every 6 (six) hours as needed for moderate pain. (Patient not taking: Reported on 01/13/2024) 30 tablet 0   ibuprofen  (ADVIL ) 800 MG tablet Take 1 tablet (800 mg total) by mouth 3 (three) times daily. (Patient not taking: Reported on 01/13/2024) 90 tablet 1   levothyroxine  (SYNTHROID ) 175 MCG tablet Take 175 mcg by mouth daily. (Patient not taking: Reported on 01/13/2024)     meloxicam  (MOBIC ) 15 MG tablet TAKE 1 TABLET (15 MG TOTAL) BY MOUTH DAILY. (Patient not taking: Reported on 01/13/2024) 30 tablet 1  methylPREDNISolone  (MEDROL  DOSEPAK) 4 MG TBPK tablet 6 day dose pack - take as directed (Patient not taking: Reported on 01/13/2024) 21 tablet 0   ondansetron  (ZOFRAN  ODT) 4 MG disintegrating tablet Take 1 tablet (4 mg total) by mouth every 8 (eight) hours as needed for nausea or vomiting. (Patient not taking: Reported on 01/13/2024) 20 tablet 0   No current facility-administered medications for this visit.   Allergies:  Patient has no known allergies.   Social History: The patient  reports that she has never smoked. She has never used smokeless tobacco. She reports that she does not currently use alcohol. She reports that she does not use drugs.   Family History: The patient's family history is not on file.   ROS:  Please see  the history of present illness. Otherwise, complete review of systems is positive for none  All other systems are reviewed and negative.   Physical Exam: VS:  Pulse 94   Ht 5' 7.5 (1.715 m)   Wt (!) 319 lb (144.7 kg)   SpO2 97%   BMI 49.23 kg/m , BMI Body mass index is 49.23 kg/m.  Wt Readings from Last 3 Encounters:  01/13/24 (!) 319 lb (144.7 kg)  03/29/19 270 lb (122.5 kg)  05/14/18 254 lb (115.2 kg)    General: Patient appears comfortable at rest. HEENT: Conjunctiva and lids normal, oropharynx clear with moist mucosa. Neck: Supple, no elevated JVP or carotid bruits, no thyromegaly. Lungs: Clear to auscultation, nonlabored breathing at rest. Cardiac: Regular rate and rhythm, no S3 or significant systolic murmur, no pericardial rub. Abdomen: Soft, nontender, no hepatomegaly, bowel sounds present, no guarding or rebound. Extremities: No pitting edema, distal pulses 2+. Skin: Warm and dry. Musculoskeletal: No kyphosis. Neuropsychiatric: Alert and oriented x3, affect grossly appropriate.  Recent Labwork: No results found for requested labs within last 365 days.     Component Value Date/Time   HDL 44 10/21/2018 0000   LDLCALC 160 10/21/2018 0000    Assessment and Plan:  Supine HTN with orthostatic hypotension - Orthostatic vitals today are positive for orthostatic hypotension with supine HTN.  Negative for POTS. - Aggressive p.o. hydration.  Avoid strenuous exercise, sun exposure, alcohol intake, heavy meals. - Compression socks all the way to the abdomen. - Currently on metoprolol succinate 25 mg once daily.  BP in supine position was around 160 mmHg SBP.  Switch to carvedilol 3.125 mg twice daily.  Morbid obesity with BMI 49.23 - Not able to lose weight despite diet and exercise measures.  She wants to lose weight with bariatric surgery.  Has an appointment lined up.  30 minutes spent in reviewing prior medical records, more than 3 labs, discussion and documentation.      Medication Adjustments/Labs and Tests Ordered: Current medicines are reviewed at length with the patient today.  Concerns regarding medicines are outlined above.    Disposition:  Follow up 6 months  Signed Benicia Bergevin Priya Shekela Goodridge, MD, 01/13/2024 1:39 PM    [facility] [address] [place], [place], [place] [address]

## 2024-01-13 NOTE — Patient Instructions (Addendum)
 Medication Instructions:  Your physician has recommended you make the following change in your medication:  Stop metoprolol Start carvedilol 3.125 mg twice daily Continue all other medications as prescribed  Labwork: none  Testing/Procedures: none  Follow-Up: Your physician recommends that you schedule a follow-up appointment in: 6 months  Any Other Special Instructions Will Be Listed Below (If Applicable).  If you need a refill on your cardiac medications before your next appointment, please call your pharmacy.

## 2024-01-21 ENCOUNTER — Encounter: Payer: Self-pay | Admitting: Internal Medicine

## 2024-01-23 ENCOUNTER — Other Ambulatory Visit (HOSPITAL_COMMUNITY): Payer: Self-pay | Admitting: Radiology

## 2024-01-23 DIAGNOSIS — R062 Wheezing: Secondary | ICD-10-CM

## 2024-01-30 ENCOUNTER — Ambulatory Visit (HOSPITAL_COMMUNITY)
Admission: RE | Admit: 2024-01-30 | Discharge: 2024-01-30 | Disposition: A | Discharge status: Home / Self Care | Source: Ambulatory Visit | Attending: Student | Admitting: Student

## 2024-01-30 DIAGNOSIS — R062 Wheezing: Secondary | ICD-10-CM | POA: Diagnosis present

## 2024-01-30 LAB — PULMONARY FUNCTION TEST
DL/VA % pred: 114 %
DL/VA: 5.22 ml/min/mmHg/L
DLCO unc % pred: 108 %
DLCO unc: 27.17 ml/min/mmHg
FEF 25-75 Post: 3.62 L/s
FEF 25-75 Pre: 3.58 L/s
FEF2575-%Change-Post: 1 %
FEF2575-%Pred-Post: 95 %
FEF2575-%Pred-Pre: 94 %
FEV1-%Change-Post: 3 %
FEV1-%Pred-Post: 92 %
FEV1-%Pred-Pre: 89 %
FEV1-Post: 3.32 L
FEV1-Pre: 3.2 L
FEV1FVC-%Change-Post: 0 %
FEV1FVC-%Pred-Pre: 97 %
FEV6-%Change-Post: 4 %
FEV6-%Pred-Post: 95 %
FEV6-%Pred-Pre: 92 %
FEV6-Post: 4.02 L
FEV6-Pre: 3.87 L
FEV6FVC-%Pred-Post: 100 %
FEV6FVC-%Pred-Pre: 100 %
FVC-%Change-Post: 4 %
FVC-%Pred-Post: 95 %
FVC-%Pred-Pre: 91 %
FVC-Post: 4.03 L
FVC-Pre: 3.87 L
Post FEV1/FVC ratio: 82 %
Post FEV6/FVC ratio: 100 %
Pre FEV1/FVC ratio: 83 %
Pre FEV6/FVC Ratio: 100 %

## 2024-01-30 MED ORDER — ALBUTEROL SULFATE (2.5 MG/3ML) 0.083% IN NEBU
2.5000 mg | INHALATION_SOLUTION | Freq: Once | RESPIRATORY_TRACT | Status: AC
  Administered 2024-01-30: 2.5 mg via RESPIRATORY_TRACT
